# Patient Record
Sex: Female | Born: 1985 | Race: Black or African American | Hispanic: No | Marital: Single | Smoking: Never smoker
Health system: Southern US, Community
[De-identification: ages and names within clinical notes are randomized; demographics above are authoritative.]

## PROBLEM LIST (undated history)

## (undated) ENCOUNTER — Inpatient Hospital Stay (HOSPITAL_COMMUNITY): Payer: Self-pay

## (undated) DIAGNOSIS — I10 Essential (primary) hypertension: Secondary | ICD-10-CM

## (undated) DIAGNOSIS — O24419 Gestational diabetes mellitus in pregnancy, unspecified control: Secondary | ICD-10-CM

## (undated) HISTORY — DX: Gestational diabetes mellitus in pregnancy, unspecified control: O24.419

## (undated) HISTORY — DX: Essential (primary) hypertension: I10

## (undated) HISTORY — PX: NO PAST SURGERIES: SHX2092

---

## 2008-01-23 ENCOUNTER — Emergency Department (HOSPITAL_COMMUNITY): Admission: EM | Admit: 2008-01-23 | Discharge: 2008-01-23 | Payer: Self-pay | Admitting: Emergency Medicine

## 2011-06-14 ENCOUNTER — Emergency Department (HOSPITAL_COMMUNITY)
Admission: EM | Admit: 2011-06-14 | Discharge: 2011-06-14 | Disposition: A | Discharge status: Home / Self Care | Payer: No Typology Code available for payment source | Attending: Emergency Medicine | Admitting: Emergency Medicine

## 2011-06-14 ENCOUNTER — Encounter (HOSPITAL_COMMUNITY): Payer: Self-pay

## 2011-06-14 ENCOUNTER — Emergency Department (HOSPITAL_COMMUNITY): Payer: No Typology Code available for payment source

## 2011-06-14 DIAGNOSIS — M79609 Pain in unspecified limb: Secondary | ICD-10-CM | POA: Insufficient documentation

## 2011-06-14 DIAGNOSIS — M7989 Other specified soft tissue disorders: Secondary | ICD-10-CM | POA: Insufficient documentation

## 2011-06-14 DIAGNOSIS — M546 Pain in thoracic spine: Secondary | ICD-10-CM | POA: Insufficient documentation

## 2011-06-14 DIAGNOSIS — S161XXA Strain of muscle, fascia and tendon at neck level, initial encounter: Secondary | ICD-10-CM

## 2011-06-14 DIAGNOSIS — S139XXA Sprain of joints and ligaments of unspecified parts of neck, initial encounter: Secondary | ICD-10-CM | POA: Insufficient documentation

## 2011-06-14 DIAGNOSIS — S6390XA Sprain of unspecified part of unspecified wrist and hand, initial encounter: Secondary | ICD-10-CM | POA: Insufficient documentation

## 2011-06-14 DIAGNOSIS — M25569 Pain in unspecified knee: Secondary | ICD-10-CM | POA: Insufficient documentation

## 2011-06-14 LAB — POCT PREGNANCY, URINE: Preg Test, Ur: NEGATIVE

## 2011-06-14 MED ORDER — IBUPROFEN 400 MG PO TABS: 400.0000 mg | ORAL_TABLET | Freq: Three times a day (TID) | ORAL | Status: AC | PRN

## 2011-06-14 MED ORDER — IBUPROFEN 800 MG PO TABS
800.0000 mg | ORAL_TABLET | Freq: Once | ORAL | Status: AC
  Administered 2011-06-14: 800 mg via ORAL
  Filled 2011-06-14: qty 1

## 2011-06-14 MED ORDER — HYDROCODONE-ACETAMINOPHEN 5-325 MG PO TABS: 1.0000 | ORAL_TABLET | ORAL | Status: AC | PRN

## 2011-06-14 MED ORDER — HYDROCODONE-ACETAMINOPHEN 5-325 MG PO TABS
1.0000 | ORAL_TABLET | Freq: Once | ORAL | Status: AC
  Administered 2011-06-14: 1 via ORAL
  Filled 2011-06-14: qty 1

## 2011-06-14 MED ORDER — DIAZEPAM 5 MG PO TABS: 5.0000 mg | ORAL_TABLET | Freq: Three times a day (TID) | ORAL | Status: AC | PRN

## 2011-06-14 MED ORDER — DIAZEPAM 5 MG PO TABS
5.0000 mg | ORAL_TABLET | Freq: Once | ORAL | Status: AC
  Administered 2011-06-14: 5 mg via ORAL
  Filled 2011-06-14: qty 1
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
## 2011-06-24 NOTE — ED Provider Notes (Signed)
Medical screening examination/treatment/procedure(s) were performed by non-physician practitioner and as supervising physician I was immediately available for consultation/collaboration.  Rashawd Laskaris, MD 06/24/11 0057 

## 2013-06-24 ENCOUNTER — Emergency Department (HOSPITAL_BASED_OUTPATIENT_CLINIC_OR_DEPARTMENT_OTHER)
Admission: EM | Admit: 2013-06-24 | Discharge: 2013-06-24 | Disposition: A | Discharge status: Home / Self Care | Payer: Medicaid Other | Attending: Emergency Medicine | Admitting: Emergency Medicine

## 2013-06-24 ENCOUNTER — Encounter (HOSPITAL_BASED_OUTPATIENT_CLINIC_OR_DEPARTMENT_OTHER): Payer: Self-pay | Admitting: Emergency Medicine

## 2013-06-24 DIAGNOSIS — R0982 Postnasal drip: Secondary | ICD-10-CM | POA: Insufficient documentation

## 2013-06-24 DIAGNOSIS — J3489 Other specified disorders of nose and nasal sinuses: Secondary | ICD-10-CM | POA: Insufficient documentation

## 2013-06-24 DIAGNOSIS — J029 Acute pharyngitis, unspecified: Secondary | ICD-10-CM | POA: Insufficient documentation

## 2013-06-24 LAB — RAPID STREP SCREEN (MED CTR MEBANE ONLY): STREPTOCOCCUS, GROUP A SCREEN (DIRECT): NEGATIVE

## 2013-06-24 MED ORDER — DEXAMETHASONE SODIUM PHOSPHATE 10 MG/ML IJ SOLN
10.0000 mg | Freq: Once | INTRAMUSCULAR | Status: AC
  Administered 2013-06-24: 10 mg via INTRAMUSCULAR
  Filled 2013-06-24: qty 1

## 2013-06-24 NOTE — Discharge Instructions (Signed)
Pharyngitis °Pharyngitis is redness, pain, and swelling (inflammation) of your pharynx.  °CAUSES  °Pharyngitis is usually caused by infection. Most of the time, these infections are from viruses (viral) and are part of a cold. However, sometimes pharyngitis is caused by bacteria (bacterial). Pharyngitis can also be caused by allergies. Viral pharyngitis may be spread from person to person by coughing, sneezing, and personal items or utensils (cups, forks, spoons, toothbrushes). Bacterial pharyngitis may be spread from person to person by more intimate contact, such as kissing.  °SIGNS AND SYMPTOMS  °Symptoms of pharyngitis include:   °· Sore throat.   °· Tiredness (fatigue).   °· Low-grade fever.   °· Headache. °· Joint pain and muscle aches. °· Skin rashes. °· Swollen lymph nodes. °· Plaque-like film on throat or tonsils (often seen with bacterial pharyngitis). °DIAGNOSIS  °Your health care provider will ask you questions about your illness and your symptoms. Your medical history, along with a physical exam, is often all that is needed to diagnose pharyngitis. Sometimes, a rapid strep test is done. Other lab tests may also be done, depending on the suspected cause.  °TREATMENT  °Viral pharyngitis will usually get better in 3 4 days without the use of medicine. Bacterial pharyngitis is treated with medicines that kill germs (antibiotics).  °HOME CARE INSTRUCTIONS  °· Drink enough water and fluids to keep your urine clear or pale yellow.   °· Only take over-the-counter or prescription medicines as directed by your health care provider:   °· If you are prescribed antibiotics, make sure you finish them even if you start to feel better.   °· Do not take aspirin.   °· Get lots of rest.   °· Gargle with 8 oz of salt water (½ tsp of salt per 1 qt of water) as often as every 1 2 hours to soothe your throat.   °· Throat lozenges (if you are not at risk for choking) or sprays may be used to soothe your throat. °SEEK MEDICAL  CARE IF:  °· You have large, tender lumps in your neck. °· You have a rash. °· You cough up green, yellow-brown, or bloody spit. °SEEK IMMEDIATE MEDICAL CARE IF:  °· Your neck becomes stiff. °· You drool or are unable to swallow liquids. °· You vomit or are unable to keep medicines or liquids down. °· You have severe pain that does not go away with the use of recommended medicines. °· You have trouble breathing (not caused by a stuffy nose). °MAKE SURE YOU:  °· Understand these instructions. °· Will watch your condition. °· Will get help right away if you are not doing well or get worse. °Document Released: 05/02/2005 Document Revised: 02/20/2013 Document Reviewed: 01/07/2013 °Exit[employer]® Patient Information ©2014 [employer]. ° °

## 2013-06-24 NOTE — ED Notes (Signed)
Sore throat onset 3 days ago states coughs throughout the night also has some sinus congestion  And feels like her tonsils are swollen up

## 2013-06-24 NOTE — ED Provider Notes (Signed)
CSN: [id number]     Arrival date & time 06/24/13  0829 History   First MD Initiated Contact with Patient 06/24/13 [id number]     Chief Complaint  Patient presents with  . Sore Throat   (Consider location/radiation/quality/duration/timing/severity/associated sxs/prior Treatment) HPI Comments: Patient presents with a one-week history of swelling to her tonsils. She's had some pain to her tonsils on swallowing over the last 3 days. She's also had some postnasal drip and congestion in the back of her throat. She denies any fevers or chills. There is no nausea vomiting or diarrhea. She denies any shortness of breath or chest congestion. She denies any rashes. She's not been taking any medications at home for the symptoms.  Patient is a 28 y.o. female presenting with pharyngitis.  Sore Throat Pertinent negatives include no chest pain, no abdominal pain, no headaches and no shortness of breath.    History reviewed. No pertinent past medical history. History reviewed. No pertinent past surgical history. History reviewed. No pertinent family history. History  Substance Use Topics  . Smoking status: Never Smoker   . Smokeless tobacco: Not on file  . Alcohol Use: Yes   OB History   Grav Para Term Preterm Abortions TAB SAB Ect Mult Living                 Review of Systems  Constitutional: Negative for fever, chills, diaphoresis and fatigue.  HENT: Positive for congestion, postnasal drip and sore throat. Negative for rhinorrhea, sneezing, trouble swallowing and voice change.   Eyes: Negative.   Respiratory: Negative for cough, chest tightness and shortness of breath.   Cardiovascular: Negative for chest pain and leg swelling.  Gastrointestinal: Negative for nausea, vomiting, abdominal pain, diarrhea and blood in stool.  Genitourinary: Negative for frequency, hematuria, flank pain and difficulty urinating.  Musculoskeletal: Negative for arthralgias and back pain.  Skin: Negative for rash.   Neurological: Negative for dizziness, speech difficulty, weakness, numbness and headaches.    Allergies  Review of patient's allergies indicates no known allergies.  Home Medications   Current Outpatient Rx  Name  Route  Sig  Dispense  Refill  . ibuprofen (ADVIL,MOTRIN) 200 MG tablet   Oral   Take 400 mg by mouth every 6 (six) hours as needed. For pain          BP 143/93  Pulse 84  Temp(Src) 98 F (36.7 C) (Oral)  Resp 18  Ht 5\' 11"  (1.803 m)  Wt 240 lb (108.863 kg)  BMI 33.49 kg/m2  SpO2 97%  LMP 06/24/2013 Physical Exam  Constitutional: She is oriented to person, place, and time. She appears well-developed and well-nourished.  HENT:  Head: Normocephalic and atraumatic.  Right Ear: External ear normal.  Left Ear: External ear normal.  Nose: Nose normal.  The tonsils are enlarged bilaterally with some mild erythema. There's no exudates. Uvula is midline. No trismus.  Eyes: Pupils are equal, round, and reactive to light.  Neck: Normal range of motion. Neck supple.  Cardiovascular: Normal rate, regular rhythm and normal heart sounds.   Pulmonary/Chest: Effort normal and breath sounds normal. No respiratory distress. She has no wheezes. She has no rales. She exhibits no tenderness.  Abdominal: Soft. Bowel sounds are normal. There is no tenderness. There is no rebound and no guarding.  Musculoskeletal: Normal range of motion. She exhibits no edema.  Lymphadenopathy:    She has cervical adenopathy (mild).  Neurological: She is alert and oriented to person, place, and time.  Skin:  Skin is warm and dry. No rash noted.  Psychiatric: She has a normal mood and affect.    ED Course  Procedures (including critical care time) Labs Review Labs Reviewed  RAPID STREP SCREEN  CULTURE, GROUP A STREP   Imaging Review No results found.  EKG Interpretation   None       MDM   1. Pharyngitis    Patient negative rapid strep. Throat culture was obtained. I advised in  symptomatic care for her likely viral infection. She was given a shot of Decadron in the ED for symptomatic relief.    Rolan BuccoMelanie Vega Stare, MD 06/24/13 [phone]

## 2013-06-24 NOTE — ED Notes (Signed)
MD at bedside. 

## 2013-06-26 LAB — CULTURE, GROUP A STREP

## 2013-07-05 ENCOUNTER — Encounter (HOSPITAL_BASED_OUTPATIENT_CLINIC_OR_DEPARTMENT_OTHER): Payer: Self-pay | Admitting: Emergency Medicine

## 2013-07-05 ENCOUNTER — Emergency Department (HOSPITAL_BASED_OUTPATIENT_CLINIC_OR_DEPARTMENT_OTHER)
Admission: EM | Admit: 2013-07-05 | Discharge: 2013-07-05 | Disposition: A | Discharge status: Home / Self Care | Payer: Medicaid Other | Attending: Emergency Medicine | Admitting: Emergency Medicine

## 2013-07-05 DIAGNOSIS — J029 Acute pharyngitis, unspecified: Secondary | ICD-10-CM | POA: Insufficient documentation

## 2013-07-05 DIAGNOSIS — B86 Scabies: Secondary | ICD-10-CM | POA: Insufficient documentation

## 2013-07-05 MED ORDER — PERMETHRIN 5 % EX CREA: TOPICAL_CREAM | CUTANEOUS | Status: DC

## 2013-07-05 NOTE — ED Provider Notes (Signed)
Medical screening examination/treatment/procedure(s) were performed by non-physician practitioner and as supervising physician I was immediately available for consultation/collaboration.  EKG Interpretation   None          Glynn OctaveStephen Myriah Boggus, MD 07/05/13 [phone]

## 2013-07-05 NOTE — ED Provider Notes (Signed)
CSN: [id number]     Arrival date & time 07/05/13  1548 History   First MD Initiated Contact with Patient 07/05/13 1556     Chief Complaint  Patient presents with  . Rash     (Consider location/radiation/quality/duration/timing/severity/associated sxs/prior Treatment) HPI Comments: Patient is 28 year old female who presents to the ED with rash - she states that she initially noticed the rash about 4 days ago at the crease of bilateral forearms and to her breasts.  She states that since then it has become intensely itchy and worsening.  She reports the rash is red, raised and forms lines.  She states that no other person in her home has similar.  She denies fever, chills, nausea or vomiting - she was seen several days ago for sore throat and says that she continues with this.  She reports pain with swallowing but no difficulty swallowing, is able to handle secretions.  Patient is a 28 y.o. female presenting with rash. The history is provided by the patient. No language interpreter was used.  Rash Location:  Head/neck, torso and shoulder/arm Head/neck rash location:  Scalp, L ear, R ear, L neck and R neck Shoulder/arm rash location:  L upper arm, R upper arm, L forearm, R forearm, L elbow and R elbow Torso rash location:  Upper back, lower back, R chest, L chest, R flank and L flank Quality: itchiness and redness   Severity:  Moderate Onset quality:  Gradual Timing:  Constant Progression:  Worsening Chronicity:  New Context: not animal contact, not chemical exposure, not exposure to similar rash, not food, not medications, not new detergent/soap, not nuts, not plant contact, not sick contacts and not sun exposure   Relieved by:  Nothing Worsened by:  Nothing tried Ineffective treatments:  None tried Associated symptoms: sore throat   Associated symptoms: no abdominal pain, no diarrhea, no fatigue, no fever, no headaches, no hoarse voice, no induration, no joint pain, no nausea, no throat  swelling, no tongue swelling, no URI and not wheezing     History reviewed. No pertinent past medical history. History reviewed. No pertinent past surgical history. History reviewed. No pertinent family history. History  Substance Use Topics  . Smoking status: Never Smoker   . Smokeless tobacco: Not on file  . Alcohol Use: Yes   OB History   Grav Para Term Preterm Abortions TAB SAB Ect Mult Living                 Review of Systems  Constitutional: Negative for fever and fatigue.  HENT: Positive for sore throat. Negative for hoarse voice.   Respiratory: Negative for wheezing.   Gastrointestinal: Negative for nausea, abdominal pain and diarrhea.  Musculoskeletal: Negative for arthralgias.  Skin: Positive for rash.  Neurological: Negative for headaches.  All other systems reviewed and are negative.      Allergies  Review of patient's allergies indicates no known allergies.  Home Medications   Current Outpatient Rx  Name  Route  Sig  Dispense  Refill  . diphenhydrAMINE (BENADRYL) 25 mg capsule   Oral   Take 50 mg by mouth every 6 (six) hours as needed.         Marland Kitchen. ibuprofen (ADVIL,MOTRIN) 200 MG tablet   Oral   Take 400 mg by mouth every 6 (six) hours as needed. For pain          BP 150/95  Pulse 95  Temp(Src) 99.3 F (37.4 C) (Oral)  Resp 20  Ht 5\' 11"  (1.803 m)  Wt 245 lb (111.131 kg)  BMI 34.19 kg/m2  SpO2 95%  LMP 06/24/2013 Physical Exam  Nursing note and vitals reviewed. Constitutional: She is oriented to person, place, and time. She appears well-developed and well-nourished. No distress.  HENT:  Head: Normocephalic and atraumatic.  Right Ear: External ear normal.  Left Ear: External ear normal.  Nose: Nose normal.  Mouth/Throat: No oropharyngeal exudate.  Mild posterior pharyngeal erythema  Eyes: Conjunctivae are normal. Pupils are equal, round, and reactive to light. No scleral icterus.  Pulmonary/Chest: Effort normal.  Musculoskeletal:  Normal range of motion. She exhibits no edema and no tenderness.  Neurological: She is alert and oriented to person, place, and time. She exhibits normal muscle tone. Coordination normal.  Skin: Skin is warm and dry. Rash noted. There is erythema. No pallor.  Linear erythematous papular rash to bilateral antecubital fossa, back, chest.  Psychiatric: She has a normal mood and affect. Her behavior is normal. Judgment and thought content normal.    ED Course  Procedures (including critical care time) Labs Review Labs Reviewed - No data to display Imaging Review No results found.  EKG Interpretation   None       MDM   Scabies  Patient here with intensely itchy linear rash c/w scabies infestation.   Lynn Gallegos, [place] 07/05/13 1623

## 2013-07-05 NOTE — Discharge Instructions (Signed)
Scabies  Scabies are small bugs (mites) that burrow under the skin and cause red bumps and severe itching. These bugs can only be seen with a microscope. Scabies are highly contagious. They can spread easily from person to person by direct contact. They are also spread through sharing clothing or linens that have the scabies mites living in them. It is not unusual for an entire family to become infected through shared towels, clothing, or bedding.   HOME CARE INSTRUCTIONS   · Your caregiver may prescribe a cream or lotion to kill the mites. If cream is prescribed, massage the cream into the entire body from the neck to the bottom of both feet. Also massage the cream into the scalp and face if your child is less than 1 year old. Avoid the eyes and mouth. Do not wash your hands after application.  · Leave the cream on for 8 to 12 hours. Your child should bathe or shower after the 8 to 12 hour application period. Sometimes it is helpful to apply the cream to your child right before bedtime.  · One treatment is usually effective and will eliminate approximately 95% of infestations. For severe cases, your caregiver may decide to repeat the treatment in 1 week. Everyone in your household should be treated with one application of the cream.  · New rashes or burrows should not appear within 24 to 48 hours after successful treatment. However, the itching and rash may last for 2 to 4 weeks after successful treatment. Your caregiver may prescribe a medicine to help with the itching or to help the rash go away more quickly.  · Scabies can live on clothing or linens for up to 3 days. All of your child's recently used clothing, towels, stuffed toys, and bed linens should be washed in hot water and then dried in a dryer for at least 20 minutes on high heat. Items that cannot be washed should be enclosed in a plastic bag for at least 3 days.  · To help relieve itching, bathe your child in a cool bath or apply cool washcloths to the  affected areas.  · Your child may return to school after treatment with the prescribed cream.  SEEK MEDICAL CARE IF:   · The itching persists longer than 4 weeks after treatment.  · The rash spreads or becomes infected. Signs of infection include red blisters or yellow-tan crust.  Document Released: 05/02/2005 Document Revised: 07/25/2011 Document Reviewed: 09/10/2008  [employer]® Patient Information ©2014 [employer].

## 2013-07-05 NOTE — ED Notes (Signed)
Pt c/o red rash generalized over limbs, trunk.  Pt states that she was recently seen for sore throat, continues to c/o of same.

## 2013-12-11 ENCOUNTER — Encounter: Payer: Self-pay | Admitting: Obstetrics

## 2013-12-11 ENCOUNTER — Ambulatory Visit (INDEPENDENT_AMBULATORY_CARE_PROVIDER_SITE_OTHER): Payer: Medicaid Other | Admitting: Obstetrics

## 2013-12-11 VITALS — BP 133/85 | HR 88 | Temp 97.8°F | Wt 247.0 lb

## 2013-12-11 DIAGNOSIS — Z3400 Encounter for supervision of normal first pregnancy, unspecified trimester: Secondary | ICD-10-CM

## 2013-12-11 DIAGNOSIS — Z3402 Encounter for supervision of normal first pregnancy, second trimester: Secondary | ICD-10-CM

## 2013-12-11 LAB — POCT URINALYSIS DIPSTICK
Glucose, UA: NEGATIVE
KETONES UA: NEGATIVE
LEUKOCYTES UA: NEGATIVE
Nitrite, UA: NEGATIVE
PH UA: 5
RBC UA: NEGATIVE
SPEC GRAV UA: 1.025

## 2013-12-11 NOTE — Progress Notes (Signed)
  Subjective:    Lynn Gallegos is a 28 y.o. female being seen today for her obstetrical visit. She is at [redacted]w[redacted]d gestation. Patient reports: no complaints.  Problem List Items Addressed This Visit   None    Visit Diagnoses   Encounter for supervision of normal first pregnancy in second trimester    -  Primary    Relevant Orders       POCT urinalysis dipstick (Completed)       Obstetric panel       HIV antibody       Hemoglobinopathy evaluation       Varicella zoster antibody, IgG       Vit D  25 hydroxy (rtn osteoporosis monitoring)       Culture, OB Urine       Pap IG w/ reflex to HPV when ASC-U       WET PREP BY MOLECULAR PROBE       GC/Chlamydia Probe Amp      There are no active problems to display for this patient.   Objective:     BP 133/85  Pulse 88  Temp(Src) 97.8 F (36.6 C)  Wt 247 lb (112.038 kg)  LMP 09/15/2013 Uterine Size: Below umbilicus     Assessment:    Pregnancy @ [redacted]w[redacted]d  weeks Doing well    Plan:    Problem list reviewed and updated. Labs reviewed.  Follow up in 3 weeks. FIRST/CF mutation testing/NIPT/QUAD SCREEN/fragile X/Ashkenazi Jewish population testing/Spinal muscular atrophy discussed: requested. Role of ultrasound in pregnancy discussed; fetal survey: requested. Amniocentesis discussed: not indicated.

## 2013-12-11 NOTE — Addendum Note (Signed)
Addended by: Marya LandryFOSTER, Karmelo Bass D on: 12/11/2013 03:47 PM   Modules accepted: Orders

## 2013-12-12 ENCOUNTER — Other Ambulatory Visit: Payer: Self-pay | Admitting: *Deleted

## 2013-12-12 DIAGNOSIS — N760 Acute vaginitis: Principal | ICD-10-CM

## 2013-12-12 DIAGNOSIS — B9689 Other specified bacterial agents as the cause of diseases classified elsewhere: Secondary | ICD-10-CM

## 2013-12-12 LAB — OBSTETRIC PANEL
ANTIBODY SCREEN: NEGATIVE
BASOS ABS: 0 10*3/uL (ref 0.0–0.1)
BASOS PCT: 0 % (ref 0–1)
EOS ABS: 0.1 10*3/uL (ref 0.0–0.7)
EOS PCT: 1 % (ref 0–5)
HEMATOCRIT: 35.4 % — AB (ref 36.0–46.0)
Hemoglobin: 11.7 g/dL — ABNORMAL LOW (ref 12.0–15.0)
Hepatitis B Surface Ag: NEGATIVE
Lymphocytes Relative: 20 % (ref 12–46)
Lymphs Abs: 1.6 10*3/uL (ref 0.7–4.0)
MCH: 26.2 pg (ref 26.0–34.0)
MCHC: 33.1 g/dL (ref 30.0–36.0)
MCV: 79.2 fL (ref 78.0–100.0)
MONO ABS: 0.3 10*3/uL (ref 0.1–1.0)
MONOS PCT: 4 % (ref 3–12)
Neutro Abs: 6 10*3/uL (ref 1.7–7.7)
Neutrophils Relative %: 75 % (ref 43–77)
Platelets: 198 10*3/uL (ref 150–400)
RBC: 4.47 MIL/uL (ref 3.87–5.11)
RDW: 15.1 % (ref 11.5–15.5)
RH TYPE: POSITIVE
Rubella: 2.94 Index — ABNORMAL HIGH (ref <0.90)
WBC: 8 10*3/uL (ref 4.0–10.5)

## 2013-12-12 LAB — OB RESULTS CONSOLE GC/CHLAMYDIA
CHLAMYDIA, DNA PROBE: NEGATIVE
GC PROBE AMP, GENITAL: NEGATIVE

## 2013-12-12 LAB — PAP IG W/ RFLX HPV ASCU

## 2013-12-12 LAB — VITAMIN D 25 HYDROXY (VIT D DEFICIENCY, FRACTURES): Vit D, 25-Hydroxy: 25 ng/mL — ABNORMAL LOW (ref 30–89)

## 2013-12-12 LAB — WET PREP BY MOLECULAR PROBE
CANDIDA SPECIES: NEGATIVE
Gardnerella vaginalis: POSITIVE — AB
TRICHOMONAS VAG: NEGATIVE

## 2013-12-12 LAB — VARICELLA ZOSTER ANTIBODY, IGG: Varicella IgG: 2671 Index — ABNORMAL HIGH (ref <135.00)

## 2013-12-12 LAB — CULTURE, OB URINE
Colony Count: NO GROWTH
ORGANISM ID, BACTERIA: NO GROWTH

## 2013-12-12 LAB — HIV ANTIBODY (ROUTINE TESTING W REFLEX): HIV: NONREACTIVE

## 2013-12-12 LAB — GC/CHLAMYDIA PROBE AMP
CT PROBE, AMP APTIMA: NEGATIVE
GC PROBE AMP APTIMA: NEGATIVE

## 2013-12-12 MED ORDER — METRONIDAZOLE 500 MG PO TABS: 500.0000 mg | ORAL_TABLET | Freq: Two times a day (BID) | ORAL | Status: DC

## 2013-12-12 NOTE — Progress Notes (Signed)
Pt made aware of lab results and Metronidazole 500mg sent to pharmacy. 

## 2013-12-13 LAB — HEMOGLOBINOPATHY EVALUATION
HGB A2 QUANT: 2.8 % (ref 2.2–3.2)
HGB F QUANT: 0 % (ref 0.0–2.0)
Hemoglobin Other: 0 %
Hgb A: 97.2 % (ref 96.8–97.8)
Hgb S Quant: 0 %

## 2014-01-01 ENCOUNTER — Ambulatory Visit (INDEPENDENT_AMBULATORY_CARE_PROVIDER_SITE_OTHER): Payer: Medicaid Other | Admitting: Obstetrics

## 2014-01-01 ENCOUNTER — Encounter: Payer: Self-pay | Admitting: Obstetrics

## 2014-01-01 VITALS — BP 132/90 | HR 83 | Temp 98.2°F | Wt 246.0 lb

## 2014-01-01 DIAGNOSIS — Z1389 Encounter for screening for other disorder: Secondary | ICD-10-CM

## 2014-01-01 DIAGNOSIS — Z3402 Encounter for supervision of normal first pregnancy, second trimester: Secondary | ICD-10-CM

## 2014-01-01 DIAGNOSIS — Z3400 Encounter for supervision of normal first pregnancy, unspecified trimester: Secondary | ICD-10-CM

## 2014-01-01 DIAGNOSIS — Z3403 Encounter for supervision of normal first pregnancy, third trimester: Secondary | ICD-10-CM

## 2014-01-01 LAB — POCT URINALYSIS DIPSTICK
Bilirubin, UA: NEGATIVE
Blood, UA: NEGATIVE
Glucose, UA: NEGATIVE
KETONES UA: NEGATIVE
LEUKOCYTES UA: NEGATIVE
NITRITE UA: NEGATIVE
PH UA: 5
PROTEIN UA: NEGATIVE
Spec Grav, UA: 1.02
Urobilinogen, UA: NEGATIVE

## 2014-01-01 NOTE — Progress Notes (Signed)
  Subjective:    Lynn Gallegos is a 28 y.o. female being seen today for her obstetrical visit. She is at 8332w3d gestation. Patient reports: no complaints.  Problem List Items Addressed This Visit   None    Visit Diagnoses   Encounter for supervision of normal first pregnancy in second trimester    -  Primary    Encounter for supervision of normal first pregnancy in third trimester        Relevant Orders       POCT urinalysis dipstick       AFP, Quad Screen    Encounter for routine screening for malformation using ultrasonics        Relevant Orders       US OB Comp + 14 Wk      There are no active problems to display for this patient.   Objective:     BP 132/90  Pulse 83  Temp(Src) 98.2 F (36.8 C)  Wt 246 lb (111.585 kg)  LMP 09/15/2013 Uterine Size: Below umbilicus     Assessment:    Pregnancy @ 3232w3d  weeks Doing well    Plan:    Problem list reviewed and updated. Labs reviewed.  Follow up in 4 weeks. FIRST/CF mutation testing/NIPT/QUAD SCREEN/fragile X/Ashkenazi Jewish population testing/Spinal muscular atrophy discussed: requested. Role of ultrasound in pregnancy discussed; fetal survey: requested. Amniocentesis discussed: not indicated.

## 2014-01-06 LAB — AFP, QUAD SCREEN
AFP: 20.8 [IU]/mL
Age Alone: 1:964 {titer}
CURR GEST AGE: 15.3 wks.days
Down Syndrome Scr Risk Est: 1:25300 {titer}
HCG, Total: 14805 m[IU]/mL
INH: 97.2 pg/mL
INTERPRETATION-AFP: NEGATIVE
MOM FOR AFP: 0.94
MoM for INH: 0.64
MoM for hCG: 0.7
Open Spina bifida: NEGATIVE
Tri 18 Scr Risk Est: NEGATIVE
UE3 MOM: 0.94
UE3 VALUE: 0.3 ng/mL

## 2014-01-28 ENCOUNTER — Ambulatory Visit (HOSPITAL_COMMUNITY)
Admission: RE | Admit: 2014-01-28 | Discharge: 2014-01-28 | Disposition: A | Discharge status: Home / Self Care | Payer: Medicaid Other | Source: Ambulatory Visit | Attending: Obstetrics | Admitting: Obstetrics

## 2014-01-28 DIAGNOSIS — Z363 Encounter for antenatal screening for malformations: Secondary | ICD-10-CM | POA: Insufficient documentation

## 2014-01-28 DIAGNOSIS — Z1389 Encounter for screening for other disorder: Secondary | ICD-10-CM

## 2014-01-29 ENCOUNTER — Ambulatory Visit (INDEPENDENT_AMBULATORY_CARE_PROVIDER_SITE_OTHER): Payer: Medicaid Other | Admitting: Obstetrics

## 2014-01-29 ENCOUNTER — Encounter: Payer: Medicaid Other | Admitting: Obstetrics

## 2014-01-29 ENCOUNTER — Encounter: Payer: Self-pay | Admitting: Obstetrics

## 2014-01-29 VITALS — BP 134/82 | HR 80 | Temp 98.0°F | Wt 244.0 lb

## 2014-01-29 DIAGNOSIS — Z3400 Encounter for supervision of normal first pregnancy, unspecified trimester: Secondary | ICD-10-CM

## 2014-01-29 DIAGNOSIS — Z3402 Encounter for supervision of normal first pregnancy, second trimester: Secondary | ICD-10-CM

## 2014-01-29 NOTE — Progress Notes (Signed)
Subjective:    Lynn Gallegos is a 28 y.o. female being seen today for her obstetrical visit. She is at [redacted]w[redacted]d gestation. Patient reports: no complaints . Fetal movement: normal.  Problem List Items Addressed This Visit   None    Visit Diagnoses   Encounter for supervision of normal first pregnancy in second trimester    -  Primary    Relevant Orders       POCT urinalysis dipstick      There are no active problems to display for this patient.  Objective:    BP 134/82  Pulse 80  Temp(Src) 98 F (36.7 C)  Wt 244 lb (110.678 kg)  LMP 09/15/2013 FHT: 140 BPM  Uterine Size: size equals dates     Assessment:    Pregnancy @ [redacted]w[redacted]d    Plan:    OBGCT: ordered for next visit.  Labs, problem list reviewed and updated 2 hr GTT planned Follow up in 4 weeks.

## 2014-02-04 LAB — POCT URINALYSIS DIPSTICK
BILIRUBIN UA: NEGATIVE
GLUCOSE UA: NEGATIVE
Leukocytes, UA: NEGATIVE
Nitrite, UA: NEGATIVE
RBC UA: NEGATIVE
SPEC GRAV UA: 1.015
UROBILINOGEN UA: NEGATIVE
pH, UA: 5

## 2014-02-26 ENCOUNTER — Other Ambulatory Visit: Payer: Medicaid Other

## 2014-02-26 ENCOUNTER — Ambulatory Visit (INDEPENDENT_AMBULATORY_CARE_PROVIDER_SITE_OTHER): Payer: Medicaid Other | Admitting: Obstetrics

## 2014-02-26 VITALS — BP 137/84 | HR 76 | Temp 98.8°F | Wt 247.0 lb

## 2014-02-26 DIAGNOSIS — Z3402 Encounter for supervision of normal first pregnancy, second trimester: Secondary | ICD-10-CM

## 2014-02-26 LAB — POCT URINALYSIS DIPSTICK
BILIRUBIN UA: NEGATIVE
Blood, UA: NEGATIVE
GLUCOSE UA: NEGATIVE
KETONES UA: NEGATIVE
LEUKOCYTES UA: NEGATIVE
Nitrite, UA: NEGATIVE
PROTEIN UA: NEGATIVE
SPEC GRAV UA: 1.025
Urobilinogen, UA: NEGATIVE
pH, UA: 5

## 2014-02-27 ENCOUNTER — Encounter: Payer: Self-pay | Admitting: Obstetrics

## 2014-02-27 LAB — GLUCOSE TOLERANCE, 2 HOURS W/ 1HR
GLUCOSE, FASTING: 71 mg/dL (ref 70–99)
GLUCOSE: 98 mg/dL (ref 70–170)
Glucose, 2 hour: 76 mg/dL (ref 70–139)

## 2014-02-27 LAB — CBC
HCT: 35.6 % — ABNORMAL LOW (ref 36.0–46.0)
Hemoglobin: 11.6 g/dL — ABNORMAL LOW (ref 12.0–15.0)
MCH: 27.3 pg (ref 26.0–34.0)
MCHC: 32.6 g/dL (ref 30.0–36.0)
MCV: 83.8 fL (ref 78.0–100.0)
PLATELETS: 201 10*3/uL (ref 150–400)
RBC: 4.25 MIL/uL (ref 3.87–5.11)
RDW: 15.4 % (ref 11.5–15.5)
WBC: 10 10*3/uL (ref 4.0–10.5)

## 2014-02-27 LAB — HIV ANTIBODY (ROUTINE TESTING W REFLEX): HIV: NONREACTIVE

## 2014-02-27 LAB — RPR

## 2014-02-27 NOTE — Progress Notes (Signed)
Subjective:    Lynn Gallegos is a 28 y.o. female being seen today for her obstetrical visit. She is at 282w4d gestation. Patient reports: no complaints . Fetal movement: normal.  Problem List Items Addressed This Visit   None    Visit Diagnoses   Encounter for supervision of normal first pregnancy in second trimester    -  Primary    Relevant Orders       POCT urinalysis dipstick (Completed)       Glucose Tolerance, 2 Hours w/1 Hour       CBC       HIV antibody       RPR      There are no active problems to display for this patient.  Objective:    BP 137/84  Pulse 76  Temp(Src) 98.8 F (37.1 C)  Wt 247 lb (112.038 kg)  LMP 09/15/2013 FHT: 150 BPM  Uterine Size: size equals dates     Assessment:    Pregnancy @ 122w4d    Plan:    OBGCT: ordered.  Labs, problem list reviewed and updated 2 hr GTT planned Follow up in 4 weeks.

## 2014-03-17 ENCOUNTER — Encounter: Payer: Self-pay | Admitting: Obstetrics

## 2014-03-26 ENCOUNTER — Ambulatory Visit (INDEPENDENT_AMBULATORY_CARE_PROVIDER_SITE_OTHER): Payer: Medicaid Other | Admitting: Obstetrics

## 2014-03-26 ENCOUNTER — Encounter: Payer: Self-pay | Admitting: Obstetrics

## 2014-03-26 VITALS — BP 138/87 | HR 82 | Temp 98.4°F | Wt 251.0 lb

## 2014-03-26 DIAGNOSIS — Z3403 Encounter for supervision of normal first pregnancy, third trimester: Secondary | ICD-10-CM

## 2014-03-26 DIAGNOSIS — Z23 Encounter for immunization: Secondary | ICD-10-CM

## 2014-03-26 LAB — POCT URINALYSIS DIPSTICK
Blood, UA: NEGATIVE
Glucose, UA: NEGATIVE
KETONES UA: NEGATIVE
LEUKOCYTES UA: NEGATIVE
Nitrite, UA: NEGATIVE
PH UA: 5
PROTEIN UA: NEGATIVE
SPEC GRAV UA: 1.02

## 2014-03-26 NOTE — Progress Notes (Signed)
Subjective:    Lynn Gallegos is a 28 y.o. female being seen today for her obstetrical visit. She is at 8038w3d gestation. Patient reports: no complaints . Fetal movement: normal.  Problem List Items Addressed This Visit    None    Visit Diagnoses    Encounter for supervision of normal first pregnancy in second trimester    -  Primary    Relevant Orders       POCT urinalysis dipstick (Completed)    Need for immunization against influenza        Relevant Orders       Flu Vaccine QUAD 36+ mos IM (Fluarix) (Completed)      There are no active problems to display for this patient.  Objective:    BP 138/87 mmHg  Pulse 82  Temp(Src) 98.4 F (36.9 C)  Wt 251 lb (113.853 kg)  LMP 09/15/2013 FHT: 150 BPM  Uterine Size: size equals dates     Assessment:    Pregnancy @ 3338w3d    Plan:    OBGCT: discussed.  Labs, problem list reviewed and updated 2 hr GTT planned Follow up in 2 weeks.

## 2014-03-26 NOTE — Progress Notes (Signed)
Patient is doing well

## 2014-04-09 ENCOUNTER — Ambulatory Visit (INDEPENDENT_AMBULATORY_CARE_PROVIDER_SITE_OTHER): Payer: Medicaid Other | Admitting: Obstetrics

## 2014-04-09 ENCOUNTER — Encounter: Payer: Self-pay | Admitting: Obstetrics

## 2014-04-09 VITALS — BP 133/89 | HR 90 | Temp 97.8°F | Wt 252.0 lb

## 2014-04-09 DIAGNOSIS — Z3403 Encounter for supervision of normal first pregnancy, third trimester: Secondary | ICD-10-CM

## 2014-04-09 LAB — POCT URINALYSIS DIPSTICK
BILIRUBIN UA: NEGATIVE
Blood, UA: NEGATIVE
Glucose, UA: NEGATIVE
KETONES UA: NEGATIVE
LEUKOCYTES UA: NEGATIVE
Nitrite, UA: NEGATIVE
Spec Grav, UA: 1.02
Urobilinogen, UA: NEGATIVE
pH, UA: 6

## 2014-04-09 NOTE — Progress Notes (Signed)
Subjective:    Lynn Gallegos is a 28 y.o. female being seen today for her obstetrical visit. She is at 6490w3d gestation. Patient reports no complaints. Fetal movement: normal.  Problem List Items Addressed This Visit    None    Visit Diagnoses    Encounter for supervision of normal first pregnancy in third trimester    -  Primary    Relevant Orders       POCT urinalysis dipstick      There are no active problems to display for this patient.  Objective:    BP 133/89 mmHg  Pulse 90  Temp(Src) 97.8 F (36.6 C)  Wt 252 lb (114.306 kg)  LMP 09/15/2013 FHT:  150 BPM  Uterine Size: size equals dates  Presentation: unsure     Assessment:    Pregnancy @ 4290w3d weeks   Plan:     labs reviewed, problem list updated Consent signed. GBS sent TDAP offered  Rhogam given for RH negative Pediatrician: discussed. Infant feeding: plans to breastfeed. Maternity leave: not discussed. Cigarette smoking: never smoked. Orders Placed This Encounter  Procedures  . POCT urinalysis dipstick   No orders of the defined types were placed in this encounter.   Follow up in 2 Weeks.

## 2014-04-21 ENCOUNTER — Encounter (HOSPITAL_BASED_OUTPATIENT_CLINIC_OR_DEPARTMENT_OTHER): Payer: Self-pay | Admitting: Emergency Medicine

## 2014-04-21 ENCOUNTER — Emergency Department (HOSPITAL_BASED_OUTPATIENT_CLINIC_OR_DEPARTMENT_OTHER)
Admission: EM | Admit: 2014-04-21 | Discharge: 2014-04-22 | Disposition: A | Discharge status: Home / Self Care | Payer: Medicaid Other | Attending: Emergency Medicine | Admitting: Emergency Medicine

## 2014-04-21 DIAGNOSIS — J029 Acute pharyngitis, unspecified: Secondary | ICD-10-CM | POA: Diagnosis present

## 2014-04-21 DIAGNOSIS — I10 Essential (primary) hypertension: Secondary | ICD-10-CM | POA: Insufficient documentation

## 2014-04-21 DIAGNOSIS — L259 Unspecified contact dermatitis, unspecified cause: Secondary | ICD-10-CM | POA: Diagnosis not present

## 2014-04-21 LAB — RAPID STREP SCREEN (MED CTR MEBANE ONLY): STREPTOCOCCUS, GROUP A SCREEN (DIRECT): NEGATIVE

## 2014-04-21 NOTE — ED Provider Notes (Signed)
CSN: [id number]     Arrival date & time 04/21/14  2025 History  This chart was scribed for Lynn Buscemi Smitty CordsK Joshwa Hemric-Rasch, MD by Lynn Gallegos, ED Scribe. This patient was seen in room MH08/MH08 and the patient's care was started 11:17 PM.    Chief Complaint  Patient presents with  . sore throat and rash      Patient is a 28 y.o. female presenting with pharyngitis. The history is provided by the patient. No language interpreter was used.  Sore Throat This is a new problem. The current episode started more than 2 days ago. The problem occurs constantly. The problem has not changed since onset.Pertinent negatives include no chest pain, no abdominal pain, no headaches and no shortness of breath. Nothing aggravates the symptoms. Nothing relieves the symptoms. She has tried nothing for the symptoms. The treatment provided no relief.   HPI Comments: Lynn Gallegos is a 28 y.o. female who is pregnant who presents to the Emergency Department complaining of an unchanged sore throat that started 3 days ago. She states her PCP told her to start Claritin to see if that helped her symptoms but she has not experienced any relief.   Pt also complains of bilateral hand rash that started today at 4PM. Pt denies any new soaps or detergents at home. She states she has been at work at [employer] all day and that she did not come into contact with any different soaps or detergents recently. She says nothing has changed in the workplace that she knows of. Patient reports no modifying factors at this time. She reports no pertinent past medical history currently.    Past Medical History  Diagnosis Date  . Hypertension    Past Surgical History  Procedure Laterality Date  . No past surgeries     Family History  Problem Relation Age of Onset  . Aneurysm Mother    History  Substance Use Topics  . Smoking status: Never Smoker   . Smokeless tobacco: Never Used  . Alcohol Use: No   OB History    Gravida Para Term Preterm  AB TAB SAB Ectopic Multiple Living   1              Review of Systems  HENT: Positive for sore throat.   Respiratory: Negative for shortness of breath.   Cardiovascular: Negative for chest pain.  Gastrointestinal: Negative for abdominal pain.  Skin: Positive for rash.  Neurological: Negative for headaches.  All other systems reviewed and are negative.     Allergies  Review of patient's allergies indicates no known allergies.  Home Medications   Prior to Admission medications   Medication Sig Start Date End Date Taking? Authorizing Provider  Prenatal Vit-Fe Fumarate-FA (MULTIVITAMIN-PRENATAL) 27-0.8 MG TABS tablet Take 1 tablet by mouth daily at 12 noon.    Historical Provider, MD   BP 151/82 mmHg  Pulse 85  Temp(Src) 98.2 F (36.8 C) (Oral)  Resp 18  Ht 5\' 11"  (1.803 m)  Wt 253 lb (114.76 kg)  BMI 35.30 kg/m2  SpO2 100%  LMP 09/15/2013 Physical Exam  Constitutional: She is oriented to person, place, and time. She appears well-developed and well-nourished. No distress.  HENT:  Head: Normocephalic and atraumatic.  Right Ear: Hearing normal.  Left Ear: Hearing normal.  Nose: Nose normal.  Mouth/Throat: Oropharynx is clear and moist and mucous membranes are normal. No oropharyngeal exudate.  No swelling of lips, tongue or uvula.   Eyes: Conjunctivae and EOM are normal. Pupils  are equal, round, and reactive to light.  Neck: Normal range of motion. Neck supple. No tracheal deviation present.  Phonation intact no pain with displacement of the trachea  Cardiovascular: Normal rate, regular rhythm, S1 normal, S2 normal and normal heart sounds.  Exam reveals no gallop and no friction rub.   No murmur heard. Pulmonary/Chest: Effort normal and breath sounds normal. No stridor. No respiratory distress. She has no wheezes. She has no rales. She exhibits no tenderness.  Abdominal: Soft. Normal appearance and bowel sounds are normal. There is no hepatosplenomegaly. There is no  tenderness. There is no rebound, no guarding, no tenderness at McBurney's point and negative Murphy's sign. No hernia.  Positive fetal movement.   Musculoskeletal: Normal range of motion.  Lymphadenopathy:    She has no cervical adenopathy.  Neurological: She is alert and oriented to person, place, and time. She has normal strength. No cranial nerve deficit or sensory deficit. Coordination normal. GCS eye subscore is 4. GCS verbal subscore is 5. GCS motor subscore is 6.  Skin: Skin is warm, dry and intact. Rash noted. No cyanosis.  Macular red lesions dorsum of both hands. No warmth, itchy.   Psychiatric: She has a normal mood and affect. Her speech is normal and behavior is normal. Thought content normal.  Nursing note and vitals reviewed.   ED Course  Procedures  DIAGNOSTIC STUDIES: Oxygen Saturation is 100% on RA, normal by my interpretation.    COORDINATION OF CARE: 11:23 PM Discussed treatment plan with pt at bedside and pt agreed to plan.   Labs Review Labs Reviewed  RAPID STREP SCREEN  CULTURE, GROUP A STREP    Imaging Review No results found.   EKG Interpretation None      MDM   Final diagnoses:  None   Based on Centor criteria no indication for further testing indicated tylenol for pain and salt water gargles.  Contact dermatitis of the hand cortisone cream BID and removing the offending agent.  Follow up with your OB in 48 hours for recheck with your OB   I personally performed the services described in this documentation, which was scribed in my presence. The recorded information has been reviewed and is accurate.      Lynn Badour Smitty CordsK Edgar Corrigan-Rasch, MD 04/22/14 0005

## 2014-04-21 NOTE — ED Notes (Signed)
Pt states that rash started today and sore throat has been hurting for the last 2-3 days

## 2014-04-22 ENCOUNTER — Encounter (HOSPITAL_BASED_OUTPATIENT_CLINIC_OR_DEPARTMENT_OTHER): Payer: Self-pay | Admitting: Emergency Medicine

## 2014-04-22 MED ORDER — ACETAMINOPHEN 500 MG PO TABS: 1000.0000 mg | ORAL_TABLET | Freq: Once | ORAL | Status: DC

## 2014-04-22 MED ORDER — HYDROCORTISONE 1 % EX CREA: TOPICAL_CREAM | CUTANEOUS | Status: DC

## 2014-04-23 ENCOUNTER — Encounter: Payer: Medicaid Other | Admitting: Obstetrics

## 2014-04-23 LAB — CULTURE, GROUP A STREP

## 2014-04-30 ENCOUNTER — Encounter: Payer: Medicaid Other | Admitting: Obstetrics

## 2014-05-21 ENCOUNTER — Ambulatory Visit (INDEPENDENT_AMBULATORY_CARE_PROVIDER_SITE_OTHER): Payer: Medicaid Other | Admitting: Obstetrics

## 2014-05-21 ENCOUNTER — Encounter: Payer: Self-pay | Admitting: Obstetrics

## 2014-05-21 VITALS — BP 145/89 | HR 98 | Temp 98.0°F | Wt 257.0 lb

## 2014-05-21 DIAGNOSIS — Z3403 Encounter for supervision of normal first pregnancy, third trimester: Secondary | ICD-10-CM

## 2014-05-21 LAB — POCT URINALYSIS DIPSTICK
BILIRUBIN UA: NEGATIVE
Blood, UA: NEGATIVE
GLUCOSE UA: NEGATIVE
Ketones, UA: NEGATIVE
Nitrite, UA: NEGATIVE
PH UA: 5
SPEC GRAV UA: 1.02
Urobilinogen, UA: NEGATIVE

## 2014-05-21 NOTE — Progress Notes (Signed)
Subjective:    Lynn Gallegos is a 29 y.o. female being seen today for her obstetrical visit. She is at 4823w3d gestation. Patient reports no complaints. Fetal movement: normal.  Problem List Items Addressed This Visit    None     There are no active problems to display for this patient.  Objective:    BP 145/89 mmHg  Pulse 98  Temp(Src) 98 F (36.7 C)  Wt 257 lb (116.574 kg)  LMP 09/15/2013 FHT:  150 BPM  Uterine Size: size equals dates  Presentation: unsure     Assessment:    Pregnancy @ 3523w3d weeks   Plan:     labs reviewed, problem list updated Consent signed. GBS sent TDAP offered  Rhogam given for RH negative Pediatrician: discussed. Infant feeding: plans to breastfeed. Maternity leave: discussed. Cigarette smoking: never smoked. No orders of the defined types were placed in this encounter.   No orders of the defined types were placed in this encounter.   Follow up in 1 Week.

## 2014-05-21 NOTE — Addendum Note (Signed)
Addended by: Henriette CombsHATTON, ANDREA L on: 05/21/2014 02:10 PM   Modules accepted: Orders

## 2014-05-21 NOTE — Addendum Note (Signed)
Addended by: Marya LandryFOSTER, SUZANNE D on: 05/21/2014 02:31 PM   Modules accepted: Orders

## 2014-05-22 LAB — STREP B DNA PROBE: GBSP: NOT DETECTED

## 2014-05-29 ENCOUNTER — Encounter: Payer: Medicaid Other | Admitting: Obstetrics

## 2014-06-05 ENCOUNTER — Other Ambulatory Visit: Payer: Self-pay | Admitting: *Deleted

## 2014-06-05 ENCOUNTER — Encounter: Payer: Self-pay | Admitting: Obstetrics

## 2014-06-05 ENCOUNTER — Ambulatory Visit (INDEPENDENT_AMBULATORY_CARE_PROVIDER_SITE_OTHER): Payer: Medicaid Other | Admitting: Obstetrics

## 2014-06-05 VITALS — BP 137/87 | HR 82 | Temp 98.8°F | Wt 258.0 lb

## 2014-06-05 DIAGNOSIS — Z3403 Encounter for supervision of normal first pregnancy, third trimester: Secondary | ICD-10-CM

## 2014-06-05 DIAGNOSIS — K219 Gastro-esophageal reflux disease without esophagitis: Secondary | ICD-10-CM

## 2014-06-05 LAB — POCT URINALYSIS DIPSTICK
BILIRUBIN UA: NEGATIVE
Glucose, UA: NEGATIVE
KETONES UA: NEGATIVE
NITRITE UA: NEGATIVE
RBC UA: NEGATIVE
Spec Grav, UA: 1.015
Urobilinogen, UA: NEGATIVE
pH, UA: 6.5

## 2014-06-05 MED ORDER — OMEPRAZOLE 20 MG PO CPDR: 20.0000 mg | DELAYED_RELEASE_CAPSULE | Freq: Two times a day (BID) | ORAL | Status: DC

## 2014-06-05 NOTE — Progress Notes (Signed)
Subjective:    Lynn Gallegos is a 29 y.o. female being seen today for her obstetrical visit. She is at 821w4d gestation. Patient reports no complaints. Fetal movement: normal.  Problem List Items Addressed This Visit    None    Visit Diagnoses    Encounter for supervision of normal first pregnancy in third trimester    -  Primary    Relevant Orders    POCT urinalysis dipstick (Completed)      There are no active problems to display for this patient.   Objective:    BP 137/87 mmHg  Pulse 82  Temp(Src) 98.8 F (37.1 C)  Wt 258 lb (117.028 kg)  LMP 09/15/2013 FHT: 140 BPM  Uterine Size: size equals dates  Presentations: unsure  Pelvic Exam: Deferred    Assessment:    Pregnancy @ 6021w4d weeks   Plan:   Plans for delivery: Vaginal anticipated; labs reviewed; problem list updated Counseling: Consent signed. Infant feeding: plans to breastfeed. Cigarette smoking: never smoked. L&D discussion: symptoms of labor, discussed when to call, discussed what number to call, anesthetic/analgesic options reviewed and delivering clinician:  plans Physician. Postpartum supports and preparation: circumcision discussed and contraception plans discussed.  Follow up in 1 Week.

## 2014-06-06 ENCOUNTER — Encounter: Payer: Medicaid Other | Admitting: Obstetrics

## 2014-06-12 ENCOUNTER — Ambulatory Visit (INDEPENDENT_AMBULATORY_CARE_PROVIDER_SITE_OTHER): Payer: Medicaid Other | Admitting: Obstetrics

## 2014-06-12 VITALS — BP 136/82 | HR 84 | Temp 97.6°F | Wt 259.0 lb

## 2014-06-12 DIAGNOSIS — Z3403 Encounter for supervision of normal first pregnancy, third trimester: Secondary | ICD-10-CM

## 2014-06-12 LAB — POCT URINALYSIS DIPSTICK
Bilirubin, UA: NEGATIVE
Glucose, UA: NEGATIVE
Ketones, UA: NEGATIVE
Leukocytes, UA: NEGATIVE
Nitrite, UA: NEGATIVE
RBC UA: NEGATIVE
SPEC GRAV UA: 1.02
Urobilinogen, UA: NEGATIVE
pH, UA: 5

## 2014-06-13 ENCOUNTER — Encounter: Payer: Self-pay | Admitting: Obstetrics

## 2014-06-13 NOTE — Progress Notes (Signed)
Subjective:    Lynn Gallegos is a 29 y.o. female being seen today for her obstetrical visit. She is at 7454w5d gestation. Patient reports no complaints. Fetal movement: normal.  Problem List Items Addressed This Visit    None    Visit Diagnoses    Encounter for supervision of normal first pregnancy in third trimester    -  Primary    Relevant Orders    POCT urinalysis dipstick (Completed)      There are no active problems to display for this patient.   Objective:    BP 136/82 mmHg  Pulse 84  Temp(Src) 97.6 F (36.4 C)  Wt 259 lb (117.482 kg)  LMP 09/15/2013 FHT: 140 BPM  Uterine Size: size equals dates  Presentations: unsure  Pelvic Exam: Deferred   Assessment:    Pregnancy @ 5354w5d weeks   Plan:   Plans for delivery: Vaginal anticipated; labs reviewed; problem list updated Counseling: Consent signed. Infant feeding: plans to breastfeed. Cigarette smoking: never smoked. L&D discussion: symptoms of labor, discussed when to call, discussed what number to call, anesthetic/analgesic options reviewed and delivering clinician:  plans Physician. Postpartum supports and preparation: circumcision discussed and contraception plans discussed.  Follow up in 1 Week.

## 2014-06-18 ENCOUNTER — Encounter (HOSPITAL_COMMUNITY): Payer: Self-pay | Admitting: *Deleted

## 2014-06-18 ENCOUNTER — Inpatient Hospital Stay (HOSPITAL_COMMUNITY)
Admission: AD | Admit: 2014-06-18 | Discharge: 2014-06-20 | DRG: 782 | Disposition: A | Discharge status: Home / Self Care | Payer: Medicaid Other | Source: Ambulatory Visit | Attending: Obstetrics | Admitting: Obstetrics

## 2014-06-18 ENCOUNTER — Ambulatory Visit (INDEPENDENT_AMBULATORY_CARE_PROVIDER_SITE_OTHER): Payer: Medicaid Other | Admitting: Obstetrics

## 2014-06-18 VITALS — BP 134/95 | HR 84 | Temp 98.2°F | Wt 262.0 lb

## 2014-06-18 DIAGNOSIS — O133 Gestational [pregnancy-induced] hypertension without significant proteinuria, third trimester: Principal | ICD-10-CM | POA: Diagnosis present

## 2014-06-18 DIAGNOSIS — Z3403 Encounter for supervision of normal first pregnancy, third trimester: Secondary | ICD-10-CM

## 2014-06-18 DIAGNOSIS — R030 Elevated blood-pressure reading, without diagnosis of hypertension: Secondary | ICD-10-CM | POA: Diagnosis present

## 2014-06-18 DIAGNOSIS — Z3A39 39 weeks gestation of pregnancy: Secondary | ICD-10-CM | POA: Diagnosis present

## 2014-06-18 DIAGNOSIS — O139 Gestational [pregnancy-induced] hypertension without significant proteinuria, unspecified trimester: Secondary | ICD-10-CM | POA: Diagnosis present

## 2014-06-18 LAB — COMPREHENSIVE METABOLIC PANEL
ALBUMIN: 3.1 g/dL — AB (ref 3.5–5.2)
ALK PHOS: 123 U/L — AB (ref 39–117)
ALT: 24 U/L (ref 0–35)
AST: 18 U/L (ref 0–37)
Anion gap: 7 (ref 5–15)
BUN: 10 mg/dL (ref 6–23)
CALCIUM: 8.8 mg/dL (ref 8.4–10.5)
CHLORIDE: 106 mmol/L (ref 96–112)
CO2: 22 mmol/L (ref 19–32)
CREATININE: 0.55 mg/dL (ref 0.50–1.10)
GFR calc Af Amer: >90 mL/min (ref >90)
GFR calc non Af Amer: >90 mL/min (ref >90)
Glucose, Bld: 85 mg/dL (ref 70–99)
Potassium: 3.8 mmol/L (ref 3.5–5.1)
Sodium: 135 mmol/L (ref 135–145)
TOTAL PROTEIN: 6.9 g/dL (ref 6.0–8.3)
Total Bilirubin: 0.3 mg/dL (ref 0.3–1.2)

## 2014-06-18 LAB — CBC
HEMATOCRIT: 34.1 % — AB (ref 36.0–46.0)
HEMATOCRIT: 33.1 % — AB (ref 36.0–46.0)
HEMOGLOBIN: 11.2 g/dL — AB (ref 12.0–15.0)
Hemoglobin: 11.3 g/dL — ABNORMAL LOW (ref 12.0–15.0)
MCH: 27.3 pg (ref 26.0–34.0)
MCH: 27.9 pg (ref 26.0–34.0)
MCHC: 33.1 g/dL (ref 30.0–36.0)
MCHC: 33.8 g/dL (ref 30.0–36.0)
MCV: 82.4 fL (ref 78.0–100.0)
MCV: 82.3 fL (ref 78.0–100.0)
PLATELETS: 172 10*3/uL (ref 150–400)
Platelets: 164 10*3/uL (ref 150–400)
RBC: 4.14 MIL/uL (ref 3.87–5.11)
RBC: 4.02 MIL/uL (ref 3.87–5.11)
RDW: 13.2 % (ref 11.5–15.5)
RDW: 13.2 % (ref 11.5–15.5)
WBC: 9.7 10*3/uL (ref 4.0–10.5)
WBC: 10.1 10*3/uL (ref 4.0–10.5)

## 2014-06-18 LAB — URINALYSIS, ROUTINE W REFLEX MICROSCOPIC
Bilirubin Urine: NEGATIVE
Glucose, UA: NEGATIVE mg/dL
Hgb urine dipstick: NEGATIVE
Ketones, ur: 15 mg/dL — AB
LEUKOCYTES UA: NEGATIVE
Nitrite: NEGATIVE
Protein, ur: NEGATIVE mg/dL
Specific Gravity, Urine: 1.02 (ref 1.005–1.030)
UROBILINOGEN UA: 0.2 mg/dL (ref 0.0–1.0)
pH: 6.5 (ref 5.0–8.0)

## 2014-06-18 LAB — TYPE AND SCREEN
ABO/RH(D): O POS
Antibody Screen: NEGATIVE

## 2014-06-18 LAB — PROTEIN / CREATININE RATIO, URINE
Creatinine, Urine: 279 mg/dL
PROTEIN CREATININE RATIO: 0.04 (ref 0.00–0.15)
TOTAL PROTEIN, URINE: 11 mg/dL

## 2014-06-18 LAB — URIC ACID: Uric Acid, Serum: 4 mg/dL (ref 2.4–7.0)

## 2014-06-18 LAB — ABO/RH: ABO/RH(D): O POS

## 2014-06-18 MED ORDER — LACTATED RINGERS IV SOLN
INTRAVENOUS | Status: DC
  Administered 2014-06-18 – 2014-06-19 (×4): via INTRAVENOUS

## 2014-06-18 MED ORDER — OXYTOCIN BOLUS FROM INFUSION: 500.0000 mL | INTRAVENOUS | Status: DC

## 2014-06-18 MED ORDER — LABETALOL HCL 100 MG PO TABS: 100.0000 mg | ORAL_TABLET | Freq: Once | ORAL | Status: DC | PRN

## 2014-06-18 MED ORDER — ONDANSETRON HCL 4 MG/2ML IJ SOLN: 4.0000 mg | Freq: Four times a day (QID) | INTRAMUSCULAR | Status: DC | PRN

## 2014-06-18 MED ORDER — LIDOCAINE HCL (PF) 1 % IJ SOLN: 30.0000 mL | INTRAMUSCULAR | Status: DC | PRN

## 2014-06-18 MED ORDER — ACETAMINOPHEN 325 MG PO TABS
650.0000 mg | ORAL_TABLET | ORAL | Status: DC | PRN
Start: 2014-06-18 — End: 2014-06-20

## 2014-06-18 MED ORDER — MISOPROSTOL 25 MCG QUARTER TABLET
25.0000 ug | ORAL_TABLET | ORAL | Status: DC | PRN
  Administered 2014-06-18 – 2014-06-20 (×7): 25 ug via VAGINAL
  Filled 2014-06-18 (×2): qty 0.25
  Filled 2014-06-18: qty 1
  Filled 2014-06-18: qty 0.25
  Filled 2014-06-18: qty 1
  Filled 2014-06-18 (×2): qty 0.25
  Filled 2014-06-18: qty 1
  Filled 2014-06-18 (×2): qty 0.25

## 2014-06-18 MED ORDER — CITRIC ACID-SODIUM CITRATE 334-500 MG/5ML PO SOLN: 30.0000 mL | ORAL | Status: DC | PRN

## 2014-06-18 MED ORDER — OXYCODONE-ACETAMINOPHEN 5-325 MG PO TABS: 2.0000 | ORAL_TABLET | ORAL | Status: DC | PRN

## 2014-06-18 MED ORDER — OXYCODONE-ACETAMINOPHEN 5-325 MG PO TABS: 1.0000 | ORAL_TABLET | ORAL | Status: DC | PRN

## 2014-06-18 MED ORDER — LACTATED RINGERS IV SOLN: 500.0000 mL | INTRAVENOUS | Status: DC | PRN

## 2014-06-18 MED ORDER — TERBUTALINE SULFATE 1 MG/ML IJ SOLN: 0.2500 mg | Freq: Once | INTRAMUSCULAR | Status: AC | PRN

## 2014-06-18 MED ORDER — OXYTOCIN 40 UNITS IN LACTATED RINGERS INFUSION - SIMPLE MED: 62.5000 mL/h | INTRAVENOUS | Status: DC

## 2014-06-18 NOTE — MAU Note (Signed)
Sent from office.  BP was elevated last wk, came down when rechecked; high again today.  Denies HA, visual changes or epigastric pain, slight increase in swelling.

## 2014-06-18 NOTE — H&P (Signed)
Lynn Gallegos is a 29 y.o. female presenting for elevated BP in the office. Maternal Medical History:  Fetal activity: Perceived fetal activity is normal.   Last perceived fetal movement was within the past hour.    Prenatal complications: PIH.   Prenatal Complications - Diabetes: none.    OB History    Gravida Para Term Preterm AB TAB SAB Ectopic Multiple Living   1              Past Medical History  Diagnosis Date  . Hypertension    Past Surgical History  Procedure Laterality Date  . No past surgeries     Family History: family history includes Aneurysm in her mother. Social History:  reports that she has never smoked. She has never used smokeless tobacco. She reports that she does not drink alcohol or use illicit drugs.   Prenatal Transfer Tool  Maternal Diabetes: No Genetic Screening: Normal Maternal Ultrasounds/Referrals: Normal Fetal Ultrasounds or other Referrals:  None Maternal Substance Abuse:  No Significant Maternal Medications:  None Significant Maternal Lab Results:  None Other Comments:  None  Review of Systems  All other systems reviewed and are negative.     Blood pressure 152/84, pulse 84, temperature 98.6 F (37 C), resp. rate 16, height 5\' 9"  (1.753 m), weight 262 lb (118.842 kg), last menstrual period 09/15/2013, SpO2 100 %. Maternal Exam:  Abdomen: Patient reports no abdominal tenderness. Fetal presentation: vertex  Cervix: Cervix evaluated by digital exam.     Physical Exam  Nursing note and vitals reviewed. Constitutional: She is oriented to person, place, and time. She appears well-developed and well-nourished.  HENT:  Head: Normocephalic and atraumatic.  Eyes: Conjunctivae are normal. Pupils are equal, round, and reactive to light.  Neck: Normal range of motion. Neck supple.  Cardiovascular: Normal rate and regular rhythm.   Respiratory: Effort normal and breath sounds normal.  GI: Soft.  Genitourinary: Vagina normal and uterus  normal.  Musculoskeletal: Normal range of motion.  Neurological: She is alert and oriented to person, place, and time.  Skin: Skin is warm and dry.  Psychiatric: She has a normal mood and affect. Her behavior is normal. Judgment and thought content normal.    Prenatal labs: ABO, Rh: O/POS/-- (07/29 1323) Antibody: NEG (07/29 1323) Rubella: 2.94 (07/29 1323) RPR: NON REAC (10/14 1203)  HBsAg: NEGATIVE (07/29 1323)  HIV: NONREACTIVE (10/14 1203)  GBS: NOT DETECTED (01/06 1439)   Assessment/Plan: 39 weeks.  PIH.  Admit.  2 stage IOL.   Lynn Gallegos A 06/18/2014, 1:50 PM

## 2014-06-18 NOTE — H&P (Signed)
LABOR ADMISSION HISTORY AND PHYSICAL  Lynn Gallegos is a 29 y.o. female G1P0 with IUP at 6485w3d by LMP presenting for evaluation of elevated blood prsesures. She reports +FMs, No LOF, no VB, no blurry vision, headaches or peripheral edema, and RUQ pain.  Dating: By LMP --->  Estimated Date of Delivery: 06/22/14    Prenatal History/Complications:  Past Medical History: Past Medical History  Diagnosis Date  . Hypertension     Past Surgical History: Past Surgical History  Procedure Laterality Date  . No past surgeries      Obstetrical History: OB History    Gravida Para Term Preterm AB TAB SAB Ectopic Multiple Living   1               Social History: History   Social History  . Marital Status: Single    Spouse Name: N/A    Number of Children: N/A  . Years of Education: N/A   Social History Main Topics  . Smoking status: Never Smoker   . Smokeless tobacco: Never Used  . Alcohol Use: No  . Drug Use: No  . Sexual Activity: Yes    Birth Control/ Protection: None   Other Topics Concern  . None   Social History Narrative    Family History: Family History  Problem Relation Age of Onset  . Aneurysm Mother     Allergies: No Known Allergies  Prescriptions prior to admission  Medication Sig Dispense Refill Last Dose  . Prenatal Vit-Fe Fumarate-FA (MULTIVITAMIN-PRENATAL) 27-0.8 MG TABS tablet Take 1 tablet by mouth daily at 12 noon.   06/17/2014 at Unknown time  . omeprazole (PRILOSEC) 20 MG capsule Take 1 capsule (20 mg total) by mouth 2 (two) times daily before a meal. (Patient not taking: Reported on 06/18/2014) 60 capsule 5 Not Taking     Review of Systems   All systems reviewed and negative except as stated in HPI  Blood pressure 146/99, pulse 90, temperature 98.6 F (37 C), resp. rate 16, height 5\' 9"  (1.753 m), weight 262 lb (118.842 kg), last menstrual period 09/15/2013, SpO2 100 %. General appearance: alert and cooperative Lungs: clear to auscultation  bilaterally Heart: regular rate and rhythm Abdomen: soft, non-tender; bowel sounds normal Extremities: Homans sign is negative, no sign of DV DTR 2+     Prenatal labs: ABO, Rh: O/POS/-- (07/29 1323) Antibody: NEG (07/29 1323) Rubella:   RPR: NON REAC (10/14 1203)  HBsAg: NEGATIVE (07/29 1323)  HIV: NONREACTIVE (10/14 1203)  GBS: NOT DETECTED (01/06 1439)  2 hr Glucola normal Genetic screening  negative    Results for orders placed or performed during the hospital encounter of 06/18/14 (from the past 24 hour(s))  Protein / creatinine ratio, urine   Collection Time: 06/18/14 11:30 AM  Result Value Ref Range   Creatinine, Urine 279.00 mg/dL   Total Protein, Urine 11 mg/dL   Protein Creatinine Ratio 0.04 0.00 - 0.15  Urinalysis, Routine w reflex microscopic   Collection Time: 06/18/14 11:30 AM  Result Value Ref Range   Color, Urine YELLOW YELLOW   APPearance CLEAR CLEAR   Specific Gravity, Urine 1.020 1.005 - 1.030   pH 6.5 5.0 - 8.0   Glucose, UA NEGATIVE NEGATIVE mg/dL   Hgb urine dipstick NEGATIVE NEGATIVE   Bilirubin Urine NEGATIVE NEGATIVE   Ketones, ur 15 (A) NEGATIVE mg/dL   Protein, ur NEGATIVE NEGATIVE mg/dL   Urobilinogen, UA 0.2 0.0 - 1.0 mg/dL   Nitrite NEGATIVE NEGATIVE   Leukocytes, UA  NEGATIVE NEGATIVE  CBC   Collection Time: 06/18/14 11:58 AM  Result Value Ref Range   WBC 9.7 4.0 - 10.5 K/uL   RBC 4.14 3.87 - 5.11 MIL/uL   Hemoglobin 11.3 (L) 12.0 - 15.0 g/dL   HCT 95.6 (L) 21.3 - 08.6 %   MCV 82.4 78.0 - 100.0 fL   MCH 27.3 26.0 - 34.0 pg   MCHC 33.1 30.0 - 36.0 g/dL   RDW 57.8 46.9 - 62.9 %   Platelets 164 150 - 400 K/uL  Comprehensive metabolic panel   Collection Time: 06/18/14 11:58 AM  Result Value Ref Range   Sodium 135 135 - 145 mmol/L   Potassium 3.8 3.5 - 5.1 mmol/L   Chloride 106 96 - 112 mmol/L   CO2 22 19 - 32 mmol/L   Glucose, Bld 85 70 - 99 mg/dL   BUN 10 6 - 23 mg/dL   Creatinine, Ser 5.28 0.50 - 1.10 mg/dL   Calcium 8.8 8.4  - 41.3 mg/dL   Total Protein 6.9 6.0 - 8.3 g/dL   Albumin 3.1 (L) 3.5 - 5.2 g/dL   AST 18 0 - 37 U/L   ALT 24 0 - 35 U/L   Alkaline Phosphatase 123 (H) 39 - 117 U/L   Total Bilirubin 0.3 0.3 - 1.2 mg/dL   GFR calc non Af Amer >90 >90 mL/min   GFR calc Af Amer >90 >90 mL/min   Anion gap 7 5 - 15  Uric acid   Collection Time: 06/18/14 11:58 AM  Result Value Ref Range   Uric Acid, Serum 4.0 2.4 - 7.0 mg/dL    There are no active problems to display for this patient.   Assessment: Lynn Gallegos is a 29 y.o. G1P0 at [redacted]w[redacted]d here for IOL 2/2 gHTN with normal preEclampsia labs  #Labor:cytotec for induction #Pain: Epidural upon request #FWB: Cat I #ID:  GBS neg   ACOSTA,KRISTY ROCIO 06/18/2014, 1:03 PM

## 2014-06-19 ENCOUNTER — Encounter: Payer: Self-pay | Admitting: Obstetrics

## 2014-06-19 LAB — HIV ANTIBODY (ROUTINE TESTING W REFLEX): HIV Screen 4th Generation wRfx: NONREACTIVE

## 2014-06-19 LAB — RPR: RPR Ser Ql: NONREACTIVE

## 2014-06-19 MED ORDER — OXYTOCIN 40 UNITS IN LACTATED RINGERS INFUSION - SIMPLE MED
1.0000 m[IU]/min | INTRAVENOUS | Status: DC
  Administered 2014-06-19: 1 m[IU]/min via INTRAVENOUS
  Filled 2014-06-19: qty 1000

## 2014-06-19 MED ORDER — LABETALOL HCL 200 MG PO TABS
200.0000 mg | ORAL_TABLET | Freq: Three times a day (TID) | ORAL | Status: DC
  Administered 2014-06-19 – 2014-06-20 (×3): 200 mg via ORAL
  Filled 2014-06-19 (×4): qty 1

## 2014-06-19 NOTE — Progress Notes (Signed)
Lynn Gallegos is a 29 y.o. G1P0 at 7258w4d by LMP admitted for induction of labor due to Hypertension.  Subjective:   Objective: BP 149/85 mmHg  Pulse 78  Temp(Src) 98 F (36.7 C) (Oral)  Resp 20  Ht 5\' 9"  (1.753 m)  Wt 262 lb (118.842 kg)  BMI 38.67 kg/m2  SpO2 100%  LMP 09/15/2013 I/O last 3 completed shifts: In: 625 [I.V.:625] Out: -     FHT:  FHR: 130 bpm, variability: moderate,  accelerations:  Present,  decelerations:  Absent UC:   regular, every 2-5 minutes, mild SVE:   Dilation: Fingertip Effacement (%): 50 Station: -3 Exam by:: [email or web address]  Labs: Lab Results  Component Value Date   WBC 10.1 06/18/2014   HGB 11.2* 06/18/2014   HCT 33.1* 06/18/2014   MCV 82.3 06/18/2014   PLT 172 06/18/2014    Assessment / Plan: 39 weeks.  Gestational HTN.  2 stage IOL.  No progress on pitocin.  Will stop pitocin and resume cervical ripening with Cytotec.  Labor: None Preeclampsia:  labs stable Fetal Wellbeing:  Category I Pain Control:  Labor support without medications I/D:  n/a Anticipated MOD:  NSVD  Lynn Gallegos A 06/19/2014, 5:39 PM

## 2014-06-19 NOTE — Progress Notes (Signed)
Subjective:    Lynn Gallegos is a 29 y.o. female being seen today for her obstetrical visit. She is at 7742w4d gestation. Patient reports no complaints. Fetal movement: normal.  Problem List Items Addressed This Visit    None    Visit Diagnoses    Encounter for supervision of normal first pregnancy in third trimester    -  Primary    Relevant Orders    POCT urinalysis dipstick      Patient Active Problem List   Diagnosis Date Noted  . PIH (pregnancy induced hypertension) 06/18/2014    Objective:    BP 134/95 mmHg  Pulse 84  Temp(Src) 98.2 F (36.8 C)  Wt 262 lb (118.842 kg)  LMP 09/15/2013 FHT:  140 BPM  Uterine Size: size equals dates  Presentation: cephalic  Pelvic Exam:              Dilation: Closed       Effacement: 50%   Station:  -3     Consistency: medium            Position: middle     Assessment:    Pregnancy @ 5642w4d  weeks    Elevated BP  Plan:    Postdates management: discussed fetal surveillance and induction, discussed fetal movement, NST reactive, biophysical profile ordered [facility] of elevated BP

## 2014-06-20 ENCOUNTER — Telehealth (HOSPITAL_COMMUNITY): Payer: Self-pay | Admitting: *Deleted

## 2014-06-20 MED ORDER — LABETALOL HCL 200 MG PO TABS: 200.0000 mg | ORAL_TABLET | Freq: Three times a day (TID) | ORAL | Status: DC

## 2014-06-20 NOTE — Discharge Instructions (Signed)
Patient information: Preeclampsia (The Basics)View in SpanishWritten by the doctors and editors at [employer]  What is preeclampsia? -- Preeclampsia is a dangerous condition that some women get when they are pregnant. It usually happens during the second half of pregnancy (after 20 weeks). It can also happen during labor or after the baby is born.  Women with preeclampsia have high blood pressure and too much protein in their urine. They can also have other serious problems, such as with their liver or kidney. Plus, the baby might not grow well and be small.  What are the symptoms of preeclampsia? -- Most women with preeclampsia do not feel any different than usual. Preeclampsia usually does not cause symptoms unless it is severe. Signs and symptoms of severe preeclampsia are: A bad headache  Changes in vision: blurry vision, flashes of light, spots  Urinating less often than usual  Nausea or vomiting  Belly pain, especially in the upper belly  Swelling of the face and hands  Sudden weight gain (more than 2 pounds in a week) If you have any of these symptoms, tell your doctor or nurse. You might not have preeclampsia, because these symptoms can also occur in normal pregnancies. But its important that your doctor know about them.  You should also call your doctor or nurse if you have bleeding from the vagina. How might preeclampsia affect my baby? -- Preeclampsia can: Slow the growth of the baby  Decrease the amount of amniotic fluid around the baby (amniotic fluid is the liquid that surrounds and protects the baby in the uterus) You should call your doctor or nurse if your baby is not moving as much as usual. Your doctor or nurse will do tests to check for any problems with the baby. Is there a test for preeclampsia? -- Yes. To test for preeclampsia, your doctor or nurse will take your blood pressure and check your urine for protein at each visit during pregnancy.  When your doctor or nurse tells  you your blood pressure, he or she will say 2 numbers. For instance, your doctor or nurse might say that your blood pressure is 140 over 90. To be diagnosed with preeclampsia, your top number (called systolic pressure) must be 140 or higher, or your bottom number (called diastolic pressure) must be 90 or higher. Plus, you must have too much protein in your urine. It is possible to have high blood pressure (above 140/90) during pregnancy without having high protein in the urine. That is not preeclampsia. Still, if you develop high blood pressure, your doctor will watch you closely. You could develop preeclampsia or other problems related to high blood pressure. How is preeclampsia treated? -- The only cure for preeclampsia is to deliver the baby. Your doctor or nurse will decide whether it is better for you to have your baby right away, or to wait. If you are near your due date, your doctor will probably give you medicine to start contractions. This is called inducing labor. Most women are able to give birth the usual way, through the vagina. But in some cases the doctor will need to do a C-section. A C-section, or cesarean delivery, is a type of surgery used to get the baby out of the uterus. If your due date is not for several weeks, and your preeclampsia is not severe, your doctor or nurse might wait to deliver your baby. This is to give the baby more time to grow and develop. If your doctor or nurse decides  to wait, he or she will check you and your baby often for any problems. You might need to stay in the hospital. If your blood pressure is very high, your doctor or nurse might give you medicine to lower blood pressure. This is to keep you from having a stroke. Women with preeclampsia can sometimes have seizures. Your doctor or nurse will probably give you medicine during labor to prevent this.  What can I do to prevent preeclampsia? -- You cant do anything to keep from getting preeclampsia.  The most important thing you can do is to keep all the appointments you have with your doctor, nurse, or midwife. That way, they can find out as soon as possible if your blood pressure goes up or if you have too much protein in your urine. Also, call them right away if you have symptoms of preeclampsia or the baby isnt moving as much as usual. Your doctor or nurse can do things to keep you from having worse problems from preeclampsia. More on this topic Patient information: High blood pressure in adults (The Basics) Patient information: C-section (cesarean delivery) (The Basics) Patient information: Swelling (The Basics) Patient information: High blood pressure and pregnancy (The Basics) Patient information: HELLP syndrome (The Basics) Patient information: Preeclampsia (Beyond the Basics) Patient information: High blood pressure in adults (Beyond the Basics) Patient information: High blood pressure treatment in adults (Beyond the Basics)

## 2014-06-20 NOTE — Discharge Summary (Signed)
Physician Discharge Summary  Patient ID: Lynn Gallegos MRN: [id number] DOB/AGE: 09-16-1985 29 y.o.  Admit date: 06/18/2014 Discharge date: 06/20/2014  Admission Diagnoses: Gestational Hypertension, IOL  Discharge Diagnoses: Same, Failed IOL Active Problems:   PIH (pregnancy induced hypertension)   Discharged Condition: good  Hospital Course: Admitted for IOL for PIH at [redacted] weeks gestation.  No progress with 2 stage IOL for 2 days.  BP, labs and baby were stable.  Induction stopped and patient discharged home for 72 hour rest.  IOL rescheduled for 06-23-14 at 0730.  Consults: None  Significant Diagnostic Studies: labs: CBC, CMET  Treatments: Labetalol  Discharge Exam: Blood pressure 132/64, pulse 75, temperature 98.2 F (36.8 C), temperature source Oral, resp. rate 18, height 5\' 9"  (1.753 m), weight 262 lb (118.842 kg), last menstrual period 09/15/2013, SpO2 100 %. General appearance: alert and no distress Resp: clear to auscultation bilaterally Cardio: regular rate and rhythm, S1, S2 normal, no murmur, click, rub or gallop GI: normal findings: soft, non-tender Extremities: extremities normal, atraumatic, no cyanosis or edema Cervix:  FT / 50% / -2 / VTX  Disposition: 01-Home or Self Care     Medication List    TAKE these medications        labetalol 200 MG tablet  Commonly known as:  NORMODYNE  Take 1 tablet (200 mg total) by mouth 3 (three) times daily.     multivitamin-prenatal 27-0.8 MG Tabs tablet  Take 1 tablet by mouth daily at 12 noon.     omeprazole 20 MG capsule  Commonly known as:  PRILOSEC  Take 1 capsule (20 mg total) by mouth 2 (two) times daily before a meal.           Follow-up Information    [facility].   Why:  For resumption of induction of labor      Signed: Draco Malczewski A 06/20/2014, 9:05 AM

## 2014-06-20 NOTE — Telephone Encounter (Signed)
Preadmission screen  

## 2014-06-20 NOTE — Progress Notes (Signed)
Lynn Gallegos is a 29 y.o. G1P0 at 2350w5d by LMP admitted for induction of labor due to Hypertension.  Subjective:   Objective: BP 132/64 mmHg  Pulse 75  Temp(Src) 98.2 F (36.8 C) (Oral)  Resp 18  Ht 5\' 9"  (1.753 m)  Wt 262 lb (118.842 kg)  BMI 38.67 kg/m2  SpO2 100%  LMP 09/15/2013 I/O last 3 completed shifts: In: 625 [I.V.:625] Out: -     FHT:  FHR: 130 bpm, variability: moderate,  accelerations:  Present,  decelerations:  Absent UC:   irregular, mild SVE:   Dilation: Fingertip Effacement (%): 50 Station: -2, -1 Exam by:: e. poore, rn  Labs: Lab Results  Component Value Date   WBC 10.1 06/18/2014   HGB 11.2* 06/18/2014   HCT 33.1* 06/18/2014   MCV 82.3 06/18/2014   PLT 172 06/18/2014    Assessment / Plan: 39.5 weeks.  PIH.  Stable.  Failed 2 stage IOL.  BP, labs and baby all stable.  Discussed management options with patient and we decided to discontinue IOL.  Rest for 72 hours, and resume IOL on Monday 05-23-14 at 0730.  Labor: none Preeclampsia:  labs stable and BP stable on Labetalol Fetal Wellbeing:  Category I Pain Control:  Labor support without medications I/D:  n/a Anticipated MOD:  NSVD  Lynn Gallegos A 06/20/2014, 8:52 AM

## 2014-06-23 ENCOUNTER — Inpatient Hospital Stay (HOSPITAL_COMMUNITY)
Admission: RE | Admit: 2014-06-23 | Discharge: 2014-06-29 | DRG: 766 | Disposition: A | Discharge status: Home / Self Care | Payer: Medicaid Other | Source: Ambulatory Visit | Attending: Obstetrics | Admitting: Obstetrics

## 2014-06-23 ENCOUNTER — Encounter (HOSPITAL_COMMUNITY): Payer: Self-pay

## 2014-06-23 ENCOUNTER — Inpatient Hospital Stay (HOSPITAL_COMMUNITY): Payer: Medicaid Other

## 2014-06-23 DIAGNOSIS — O99214 Obesity complicating childbirth: Secondary | ICD-10-CM | POA: Diagnosis present

## 2014-06-23 DIAGNOSIS — O139 Gestational [pregnancy-induced] hypertension without significant proteinuria, unspecified trimester: Secondary | ICD-10-CM

## 2014-06-23 DIAGNOSIS — Z3A40 40 weeks gestation of pregnancy: Secondary | ICD-10-CM | POA: Diagnosis present

## 2014-06-23 DIAGNOSIS — Z3490 Encounter for supervision of normal pregnancy, unspecified, unspecified trimester: Secondary | ICD-10-CM

## 2014-06-23 DIAGNOSIS — Z3689 Encounter for other specified antenatal screening: Secondary | ICD-10-CM | POA: Insufficient documentation

## 2014-06-23 DIAGNOSIS — Z6838 Body mass index (BMI) 38.0-38.9, adult: Secondary | ICD-10-CM

## 2014-06-23 DIAGNOSIS — O133 Gestational [pregnancy-induced] hypertension without significant proteinuria, third trimester: Secondary | ICD-10-CM | POA: Diagnosis present

## 2014-06-23 DIAGNOSIS — Z98891 History of uterine scar from previous surgery: Secondary | ICD-10-CM

## 2014-06-23 LAB — TYPE AND SCREEN
ABO/RH(D): O POS
ANTIBODY SCREEN: NEGATIVE

## 2014-06-23 LAB — CBC
HEMATOCRIT: 31.1 % — AB (ref 36.0–46.0)
Hemoglobin: 10.6 g/dL — ABNORMAL LOW (ref 12.0–15.0)
MCH: 27.7 pg (ref 26.0–34.0)
MCHC: 34.1 g/dL (ref 30.0–36.0)
MCV: 81.4 fL (ref 78.0–100.0)
Platelets: 182 10*3/uL (ref 150–400)
RBC: 3.82 MIL/uL — ABNORMAL LOW (ref 3.87–5.11)
RDW: 13.4 % (ref 11.5–15.5)
WBC: 8.3 10*3/uL (ref 4.0–10.5)

## 2014-06-23 MED ORDER — OXYCODONE-ACETAMINOPHEN 5-325 MG PO TABS: 1.0000 | ORAL_TABLET | ORAL | Status: DC | PRN

## 2014-06-23 MED ORDER — ACETAMINOPHEN 325 MG PO TABS
650.0000 mg | ORAL_TABLET | ORAL | Status: DC | PRN
  Administered 2014-06-24: 650 mg via ORAL
  Filled 2014-06-23: qty 2

## 2014-06-23 MED ORDER — ONDANSETRON HCL 4 MG/2ML IJ SOLN
4.0000 mg | Freq: Four times a day (QID) | INTRAMUSCULAR | Status: DC | PRN
  Administered 2014-06-25: 4 mg via INTRAVENOUS
  Filled 2014-06-23: qty 2

## 2014-06-23 MED ORDER — CITRIC ACID-SODIUM CITRATE 334-500 MG/5ML PO SOLN
30.0000 mL | ORAL | Status: DC | PRN
  Administered 2014-06-26: 30 mL via ORAL
  Filled 2014-06-23: qty 15

## 2014-06-23 MED ORDER — TERBUTALINE SULFATE 1 MG/ML IJ SOLN: 0.2500 mg | Freq: Once | INTRAMUSCULAR | Status: AC | PRN

## 2014-06-23 MED ORDER — OXYTOCIN 40 UNITS IN LACTATED RINGERS INFUSION - SIMPLE MED: 62.5000 mL/h | INTRAVENOUS | Status: DC

## 2014-06-23 MED ORDER — OXYCODONE-ACETAMINOPHEN 5-325 MG PO TABS: 2.0000 | ORAL_TABLET | ORAL | Status: DC | PRN

## 2014-06-23 MED ORDER — LACTATED RINGERS IV SOLN
INTRAVENOUS | Status: DC
  Administered 2014-06-23 – 2014-06-25 (×4): via INTRAVENOUS
  Administered 2014-06-25: 1000 mL via INTRAVENOUS
  Administered 2014-06-25 – 2014-06-26 (×3): via INTRAVENOUS

## 2014-06-23 MED ORDER — LACTATED RINGERS IV SOLN
500.0000 mL | INTRAVENOUS | Status: DC | PRN
  Administered 2014-06-25 – 2014-06-26 (×2): 500 mL via INTRAVENOUS

## 2014-06-23 MED ORDER — MISOPROSTOL 200 MCG PO TABS
50.0000 ug | ORAL_TABLET | ORAL | Status: DC
  Administered 2014-06-23 – 2014-06-25 (×5): 50 ug via ORAL
  Filled 2014-06-23 (×5): qty 0.5

## 2014-06-23 MED ORDER — LABETALOL HCL 200 MG PO TABS
200.0000 mg | ORAL_TABLET | Freq: Three times a day (TID) | ORAL | Status: DC
  Administered 2014-06-23 – 2014-06-26 (×10): 200 mg via ORAL
  Filled 2014-06-23 (×12): qty 1

## 2014-06-23 MED ORDER — OXYTOCIN BOLUS FROM INFUSION: 500.0000 mL | INTRAVENOUS | Status: DC

## 2014-06-23 MED ORDER — FLEET ENEMA 7-19 GM/118ML RE ENEM: 1.0000 | ENEMA | Freq: Every day | RECTAL | Status: DC | PRN

## 2014-06-23 MED ORDER — LIDOCAINE HCL (PF) 1 % IJ SOLN
30.0000 mL | INTRAMUSCULAR | Status: DC | PRN
  Filled 2014-06-23: qty 30

## 2014-06-23 NOTE — H&P (Signed)
Lynn Gallegos is a 29 y.o. female presenting for IOL for PIH. Maternal Medical History:  Fetal activity: Perceived fetal activity is normal.    Prenatal complications: PIH.   Prenatal Complications - Diabetes: none.    OB History    Gravida Para Term Preterm AB TAB SAB Ectopic Multiple Living   1              Past Medical History  Diagnosis Date  . Hypertension    Past Surgical History  Procedure Laterality Date  . No past surgeries     Family History: family history includes Aneurysm in her mother. Social History:  reports that she has never smoked. She has never used smokeless tobacco. She reports that she does not drink alcohol or use illicit drugs.   Prenatal Transfer Tool  Maternal Diabetes: No Genetic Screening: Normal Maternal Ultrasounds/Referrals: Normal Fetal Ultrasounds or other Referrals:  None Maternal Substance Abuse:  No Significant Maternal Medications:  Labetalol Significant Maternal Lab Results:  None Other Comments:  None  Review of Systems  All other systems reviewed and are negative.   Dilation: Fingertip Effacement (%): 50 Station: -2 Exam by:: Marijean HeathM. Robinson, RN Blood pressure 144/93, pulse 81, temperature 98.2 F (36.8 C), temperature source Oral, resp. rate 14, height 5\' 9"  (1.753 m), weight 262 lb (118.842 kg), last menstrual period 09/15/2013. Maternal Exam:  Abdomen: Patient reports no abdominal tenderness. Fetal presentation: vertex  Cervix: Cervix evaluated by digital exam.     Physical Exam  Nursing note and vitals reviewed. Constitutional: She is oriented to person, place, and time. She appears well-developed and well-nourished.  HENT:  Head: Normocephalic and atraumatic.  Eyes: Conjunctivae are normal. Pupils are equal, round, and reactive to light.  Neck: Normal range of motion. Neck supple.  Cardiovascular: Normal rate and regular rhythm.   Respiratory: Effort normal and breath sounds normal.  GI: Soft. Bowel sounds are  normal.  Genitourinary: Vagina normal and uterus normal.  Musculoskeletal: Normal range of motion.  Neurological: She is alert and oriented to person, place, and time.  Skin: Skin is warm and dry.  Psychiatric: She has a normal mood and affect. Her behavior is normal. Judgment and thought content normal.    Prenatal labs: ABO, Rh: --/--/O POS (02/08 0825) Antibody: NEG (02/08 0825) Rubella: 2.94 (07/29 1323) RPR: Non Reactive (02/03 1405)  HBsAg: NEGATIVE (07/29 1323)  HIV: NONREACTIVE (10/14 1203)  GBS: NOT DETECTED (01/06 1439)   Assessment/Plan: 40 weeks.  PIH.  2 stage IOL.   Lynn Gallegos A 06/23/2014, 3:48 PM

## 2014-06-24 LAB — RPR: RPR: NONREACTIVE

## 2014-06-24 MED ORDER — OXYTOCIN 40 UNITS IN LACTATED RINGERS INFUSION - SIMPLE MED
1.0000 m[IU]/min | INTRAVENOUS | Status: DC
  Administered 2014-06-24: 2 m[IU]/min via INTRAVENOUS
  Filled 2014-06-24: qty 1000

## 2014-06-24 MED ORDER — NALBUPHINE HCL 10 MG/ML IJ SOLN
10.0000 mg | Freq: Once | INTRAMUSCULAR | Status: AC
  Administered 2014-06-24: 10 mg via INTRAVENOUS
  Filled 2014-06-24: qty 1

## 2014-06-24 MED ORDER — OXYTOCIN 40 UNITS IN LACTATED RINGERS INFUSION - SIMPLE MED
1.0000 m[IU]/min | INTRAVENOUS | Status: DC
  Administered 2014-06-25: 5 m[IU]/min via INTRAVENOUS
  Administered 2014-06-25: 2 m[IU]/min via INTRAVENOUS
  Administered 2014-06-25: 6 m[IU]/min via INTRAVENOUS
  Administered 2014-06-25: 4 m[IU]/min via INTRAVENOUS

## 2014-06-24 MED ORDER — MISOPROSTOL 25 MCG QUARTER TABLET
25.0000 ug | ORAL_TABLET | ORAL | Status: DC
Start: 2014-06-24 — End: 2014-06-26
  Filled 2014-06-24 (×4): qty 1

## 2014-06-24 MED ORDER — NALBUPHINE HCL 10 MG/ML IJ SOLN
10.0000 mg | INTRAMUSCULAR | Status: DC | PRN
  Administered 2014-06-24: 10 mg via INTRAVENOUS
  Filled 2014-06-24: qty 1

## 2014-06-24 MED ORDER — TERBUTALINE SULFATE 1 MG/ML IJ SOLN: 0.2500 mg | Freq: Once | INTRAMUSCULAR | Status: AC | PRN

## 2014-06-24 NOTE — Progress Notes (Signed)
Lorin Glassoni Zea is a 29 y.o. G1P0 at 3185w2d by LMP admitted for induction of labor due to Hypertension.  Subjective:   Objective: BP 146/95 mmHg  Pulse 72  Temp(Src) 98.7 F (37.1 C) (Oral)  Resp 16  Ht 5\' 9"  (1.753 m)  Wt 262 lb (118.842 kg)  BMI 38.67 kg/m2  LMP 09/15/2013      FHT:  FHR: 120-130 bpm, variability: moderate,  accelerations:  Present,  decelerations:  Absent UC:   Irregular, mild SVE:   Dilation: Fingertip Effacement (%): 20 Station: -3 Exam by:: lee  Labs: Lab Results  Component Value Date   WBC 8.3 06/23/2014   HGB 10.6* 06/23/2014   HCT 31.1* 06/23/2014   MCV 81.4 06/23/2014   PLT 182 06/23/2014    Assessment / Plan: 40.2 weeks.  Hypertension.  2 stage IOL.  No progress with foley bulb and pitocin.  Patient and fetus doing well.  Discussed lack of progress with patient and she indicated that she does not want a C/S.  We will continue cervical ripening with Cytotec.  Labor: None Preeclampsia:  n/a Fetal Wellbeing:  Category I Pain Control:  Nubain I/D:  n/a Anticipated MOD:  NSVD  HARPER,CHARLES A 06/24/2014, 8:05 PM

## 2014-06-25 ENCOUNTER — Encounter: Payer: Medicaid Other | Admitting: Obstetrics

## 2014-06-25 ENCOUNTER — Inpatient Hospital Stay (HOSPITAL_COMMUNITY): Payer: Medicaid Other | Admitting: Anesthesiology

## 2014-06-25 LAB — CBC
HEMATOCRIT: 34.1 % — AB (ref 36.0–46.0)
Hemoglobin: 11.4 g/dL — ABNORMAL LOW (ref 12.0–15.0)
MCH: 27.9 pg (ref 26.0–34.0)
MCHC: 33.4 g/dL (ref 30.0–36.0)
MCV: 83.6 fL (ref 78.0–100.0)
Platelets: 167 10*3/uL (ref 150–400)
RBC: 4.08 MIL/uL (ref 3.87–5.11)
RDW: 13.3 % (ref 11.5–15.5)
WBC: 11.9 10*3/uL — ABNORMAL HIGH (ref 4.0–10.5)

## 2014-06-25 MED ORDER — OXYTOCIN 40 UNITS IN LACTATED RINGERS INFUSION - SIMPLE MED: 1.0000 m[IU]/min | INTRAVENOUS | Status: DC

## 2014-06-25 MED ORDER — EPHEDRINE 5 MG/ML INJ: 10.0000 mg | INTRAVENOUS | Status: DC | PRN

## 2014-06-25 MED ORDER — PHENYLEPHRINE 40 MCG/ML (10ML) SYRINGE FOR IV PUSH (FOR BLOOD PRESSURE SUPPORT)
80.0000 ug | PREFILLED_SYRINGE | INTRAVENOUS | Status: DC | PRN
  Filled 2014-06-25: qty 20

## 2014-06-25 MED ORDER — FENTANYL 2.5 MCG/ML BUPIVACAINE 1/10 % EPIDURAL INFUSION (WH - ANES): 14.0000 mL/h | INTRAMUSCULAR | Status: DC | PRN

## 2014-06-25 MED ORDER — PHENYLEPHRINE 40 MCG/ML (10ML) SYRINGE FOR IV PUSH (FOR BLOOD PRESSURE SUPPORT): 80.0000 ug | PREFILLED_SYRINGE | INTRAVENOUS | Status: DC | PRN

## 2014-06-25 MED ORDER — BUTORPHANOL TARTRATE 1 MG/ML IJ SOLN
INTRAMUSCULAR | Status: AC
  Filled 2014-06-25: qty 1

## 2014-06-25 MED ORDER — DIPHENHYDRAMINE HCL 50 MG/ML IJ SOLN: 12.5000 mg | INTRAMUSCULAR | Status: DC | PRN

## 2014-06-25 MED ORDER — FENTANYL 2.5 MCG/ML BUPIVACAINE 1/10 % EPIDURAL INFUSION (WH - ANES)
14.0000 mL/h | INTRAMUSCULAR | Status: DC | PRN
  Administered 2014-06-25 – 2014-06-26 (×4): 14 mL/h via EPIDURAL
  Filled 2014-06-25 (×4): qty 125

## 2014-06-25 MED ORDER — LACTATED RINGERS IV SOLN: 500.0000 mL | Freq: Once | INTRAVENOUS | Status: DC

## 2014-06-25 MED ORDER — LIDOCAINE HCL (PF) 1 % IJ SOLN
INTRAMUSCULAR | Status: DC | PRN
  Administered 2014-06-25 (×2): 5 mL

## 2014-06-25 MED ORDER — BUTORPHANOL TARTRATE 1 MG/ML IJ SOLN
1.0000 mg | INTRAMUSCULAR | Status: DC | PRN
  Administered 2014-06-25 (×2): 2 mg via INTRAVENOUS
  Filled 2014-06-25: qty 2
  Filled 2014-06-25: qty 1

## 2014-06-25 NOTE — Progress Notes (Signed)
Pt's support person questioning Dr Clearance CootsHarper about AROM . Dr Clearance CootsHarper explains need to del due HTN. FOB verbalizes concern over AROM due to pt discomfort.

## 2014-06-25 NOTE — Anesthesia Procedure Notes (Signed)
Epidural Patient location during procedure: OB Start time: 06/25/2014 6:33 PM  Staffing Anesthesiologist: Brayton CavesJACKSON, Tania Steinhauser Performed by: anesthesiologist   Preanesthetic Checklist Completed: patient identified, site marked, surgical consent, pre-op evaluation, timeout performed, IV checked, risks and benefits discussed and monitors and equipment checked  Epidural Patient position: sitting Prep: site prepped and draped and DuraPrep Patient monitoring: continuous pulse ox and blood pressure Approach: midline Location: L3-L4 Injection technique: LOR air  Needle:  Needle type: Tuohy  Needle gauge: 17 G Needle length: 9 cm and 9 Needle insertion depth: 8 cm Catheter type: closed end flexible Catheter size: 19 Gauge Catheter at skin depth: 14 cm Test dose: negative  Assessment Events: blood not aspirated, injection not painful, no injection resistance, negative IV test and no paresthesia  Additional Notes Patient identified.  Risk benefits discussed including failed block, incomplete pain control, headache, nerve damage, paralysis, blood pressure changes, nausea, vomiting, reactions to medication both toxic or allergic, and postpartum back pain.  Patient expressed understanding and wished to proceed.  All questions were answered.  Sterile technique used throughout procedure and epidural site dressed with sterile barrier dressing. No paresthesia or other complications noted.The patient did not experience any signs of intravascular injection such as tinnitus or metallic taste in mouth nor signs of intrathecal spread such as rapid motor block. Please see nursing notes for vital signs.

## 2014-06-25 NOTE — Progress Notes (Signed)
Lynn Gallegos is a 29 y.o. G1P0 at 7743w3d by LMP admitted for induction of labor due to Hypertension.  Subjective:   Objective: BP 129/72 mmHg  Pulse 71  Temp(Src) 97.6 F (36.4 C) (Axillary)  Resp 18  Ht 5\' 9"  (1.753 m)  Wt 262 lb (118.842 kg)  BMI 38.67 kg/m2  LMP 09/15/2013      FHT:  FHR: 120-130 bpm, variability: moderate,  accelerations:  Present,  decelerations:  Absent UC:   regular, every 5-8 minutes SVE:   Dilation: 2 Effacement (%): 70 Station: -2 Exam by:: Dhderr rn  Labs: Lab Results  Component Value Date   WBC 8.3 06/23/2014   HGB 10.6* 06/23/2014   HCT 31.1* 06/23/2014   MCV 81.4 06/23/2014   PLT 182 06/23/2014    Assessment / Plan: Induction of labor due to gestational hypertension,  progressing well on pitocin  Labor: Latent phase Preeclampsia:  stable BP and labs Fetal Wellbeing:  Category I Pain Control:  Stadol I/D:  n/a Anticipated MOD:  NSVD  HARPER,CHARLES A 06/25/2014, 2:40 PM

## 2014-06-25 NOTE — Anesthesia Preprocedure Evaluation (Signed)
Anesthesia Evaluation  Patient identified by MRN, date of birth, ID band Patient awake    Reviewed: Allergy & Precautions, H&P , Patient's Chart, lab work & pertinent test results  Airway Mallampati: III TM Distance: >3 FB Neck ROM: full    Dental   Pulmonary  breath sounds clear to auscultation        Cardiovascular hypertension, Rhythm:regular Rate:Normal     Neuro/Psych    GI/Hepatic   Endo/Other  Morbid obesity  Renal/GU      Musculoskeletal   Abdominal   Peds  Hematology   Anesthesia Other Findings   Reproductive/Obstetrics (+) Pregnancy                           Anesthesia Physical Anesthesia Plan  ASA: III  Anesthesia Plan: Epidural   Post-op Pain Management:    Induction:   Airway Management Planned:   Additional Equipment:   Intra-op Plan:   Post-operative Plan:   Informed Consent: I have reviewed the patients History and Physical, chart, labs and discussed the procedure including the risks, benefits and alternatives for the proposed anesthesia with the patient or authorized representative who has indicated his/her understanding and acceptance.     Plan Discussed with:   Anesthesia Plan Comments:         Anesthesia Quick Evaluation  

## 2014-06-26 ENCOUNTER — Encounter (HOSPITAL_COMMUNITY): Payer: Self-pay

## 2014-06-26 ENCOUNTER — Encounter (HOSPITAL_COMMUNITY)
Admission: RE | Disposition: A | Discharge status: Home / Self Care | Payer: Self-pay | Source: Ambulatory Visit | Attending: Obstetrics

## 2014-06-26 DIAGNOSIS — Z98891 History of uterine scar from previous surgery: Secondary | ICD-10-CM

## 2014-06-26 LAB — CBC
HCT: 31.2 % — ABNORMAL LOW (ref 36.0–46.0)
Hemoglobin: 10.4 g/dL — ABNORMAL LOW (ref 12.0–15.0)
MCH: 27.6 pg (ref 26.0–34.0)
MCHC: 33.3 g/dL (ref 30.0–36.0)
MCV: 82.8 fL (ref 78.0–100.0)
Platelets: 157 10*3/uL (ref 150–400)
RBC: 3.77 MIL/uL — ABNORMAL LOW (ref 3.87–5.11)
RDW: 13.2 % (ref 11.5–15.5)
WBC: 18.9 10*3/uL — ABNORMAL HIGH (ref 4.0–10.5)

## 2014-06-26 SURGERY — Surgical Case
Anesthesia: Epidural

## 2014-06-26 MED ORDER — IBUPROFEN 600 MG PO TABS
600.0000 mg | ORAL_TABLET | Freq: Four times a day (QID) | ORAL | Status: DC
  Administered 2014-06-27 – 2014-06-29 (×10): 600 mg via ORAL
  Filled 2014-06-26 (×10): qty 1

## 2014-06-26 MED ORDER — ACETAMINOPHEN 500 MG PO TABS
1000.0000 mg | ORAL_TABLET | Freq: Once | ORAL | Status: AC
  Administered 2014-06-26: 1000 mg via ORAL
  Filled 2014-06-26: qty 2

## 2014-06-26 MED ORDER — PHENYLEPHRINE 40 MCG/ML (10ML) SYRINGE FOR IV PUSH (FOR BLOOD PRESSURE SUPPORT)
PREFILLED_SYRINGE | INTRAVENOUS | Status: AC
  Filled 2014-06-26: qty 10

## 2014-06-26 MED ORDER — ACETAMINOPHEN 325 MG PO TABS: 325.0000 mg | ORAL_TABLET | ORAL | Status: DC | PRN

## 2014-06-26 MED ORDER — MEPERIDINE HCL 25 MG/ML IJ SOLN
INTRAMUSCULAR | Status: DC | PRN
  Administered 2014-06-26: 25 mg via INTRAVENOUS

## 2014-06-26 MED ORDER — MORPHINE SULFATE 0.5 MG/ML IJ SOLN
INTRAMUSCULAR | Status: AC
  Filled 2014-06-26: qty 10

## 2014-06-26 MED ORDER — MENTHOL 3 MG MT LOZG: 1.0000 | LOZENGE | OROMUCOSAL | Status: DC | PRN

## 2014-06-26 MED ORDER — DIPHENHYDRAMINE HCL 50 MG/ML IJ SOLN: 12.5000 mg | INTRAMUSCULAR | Status: DC | PRN

## 2014-06-26 MED ORDER — LACTATED RINGERS IV SOLN
INTRAVENOUS | Status: DC | PRN
  Administered 2014-06-26 (×2): via INTRAVENOUS

## 2014-06-26 MED ORDER — NALBUPHINE HCL 10 MG/ML IJ SOLN: 5.0000 mg | Freq: Once | INTRAMUSCULAR | Status: AC | PRN

## 2014-06-26 MED ORDER — KETOROLAC TROMETHAMINE 30 MG/ML IJ SOLN
30.0000 mg | Freq: Four times a day (QID) | INTRAMUSCULAR | Status: DC | PRN
Start: 2014-06-26 — End: 2014-06-26

## 2014-06-26 MED ORDER — WITCH HAZEL-GLYCERIN EX PADS
1.0000 | MEDICATED_PAD | CUTANEOUS | Status: DC | PRN
Start: 2014-06-26 — End: 2014-06-29

## 2014-06-26 MED ORDER — IBUPROFEN 600 MG PO TABS: 600.0000 mg | ORAL_TABLET | Freq: Four times a day (QID) | ORAL | Status: DC | PRN

## 2014-06-26 MED ORDER — DIBUCAINE 1 % RE OINT: 1.0000 "application " | TOPICAL_OINTMENT | RECTAL | Status: DC | PRN

## 2014-06-26 MED ORDER — DIPHENHYDRAMINE HCL 25 MG PO CAPS: 25.0000 mg | ORAL_CAPSULE | ORAL | Status: DC | PRN

## 2014-06-26 MED ORDER — LACTATED RINGERS IV SOLN
INTRAVENOUS | Status: DC
  Administered 2014-06-27 (×3): via INTRAVENOUS

## 2014-06-26 MED ORDER — OXYTOCIN 10 UNIT/ML IJ SOLN
40.0000 [IU] | INTRAVENOUS | Status: DC | PRN
  Administered 2014-06-26: 40 [IU] via INTRAVENOUS

## 2014-06-26 MED ORDER — SIMETHICONE 80 MG PO CHEW
80.0000 mg | CHEWABLE_TABLET | ORAL | Status: DC
  Administered 2014-06-27 – 2014-06-28 (×3): 80 mg via ORAL
  Filled 2014-06-26 (×2): qty 1

## 2014-06-26 MED ORDER — ONDANSETRON HCL 4 MG/2ML IJ SOLN
INTRAMUSCULAR | Status: DC | PRN
  Administered 2014-06-26: 4 mg via INTRAVENOUS

## 2014-06-26 MED ORDER — ONDANSETRON HCL 4 MG/2ML IJ SOLN: 4.0000 mg | INTRAMUSCULAR | Status: DC | PRN

## 2014-06-26 MED ORDER — OXYTOCIN 10 UNIT/ML IJ SOLN
INTRAMUSCULAR | Status: AC
  Filled 2014-06-26: qty 4

## 2014-06-26 MED ORDER — ONDANSETRON HCL 4 MG/2ML IJ SOLN
INTRAMUSCULAR | Status: AC
  Filled 2014-06-26: qty 2

## 2014-06-26 MED ORDER — MEPERIDINE HCL 25 MG/ML IJ SOLN: 6.2500 mg | INTRAMUSCULAR | Status: DC | PRN

## 2014-06-26 MED ORDER — TETANUS-DIPHTH-ACELL PERTUSSIS 5-2.5-18.5 LF-MCG/0.5 IM SUSP: 0.5000 mL | Freq: Once | INTRAMUSCULAR | Status: DC

## 2014-06-26 MED ORDER — NALOXONE HCL 1 MG/ML IJ SOLN
1.0000 ug/kg/h | INTRAVENOUS | Status: DC | PRN
  Filled 2014-06-26: qty 2

## 2014-06-26 MED ORDER — ACETAMINOPHEN 500 MG PO TABS
1000.0000 mg | ORAL_TABLET | Freq: Four times a day (QID) | ORAL | Status: AC
  Administered 2014-06-27 (×3): 1000 mg via ORAL
  Filled 2014-06-26 (×4): qty 2

## 2014-06-26 MED ORDER — DEXTROSE 5 % IV SOLN
2.0000 g | Freq: Once | INTRAVENOUS | Status: AC
  Administered 2014-06-26: 2 g via INTRAVENOUS
  Filled 2014-06-26: qty 2

## 2014-06-26 MED ORDER — LIDOCAINE-EPINEPHRINE (PF) 2 %-1:200000 IJ SOLN
INTRAMUSCULAR | Status: DC | PRN
  Administered 2014-06-26 (×2): 5 mL via EPIDURAL
  Administered 2014-06-26: 10 mL via EPIDURAL

## 2014-06-26 MED ORDER — SIMETHICONE 80 MG PO CHEW
80.0000 mg | CHEWABLE_TABLET | ORAL | Status: DC | PRN
Start: 2014-06-26 — End: 2014-06-29

## 2014-06-26 MED ORDER — PRENATAL MULTIVITAMIN CH
1.0000 | ORAL_TABLET | Freq: Every day | ORAL | Status: DC
  Administered 2014-06-27 – 2014-06-29 (×3): 1 via ORAL
  Filled 2014-06-26 (×3): qty 1

## 2014-06-26 MED ORDER — OXYTOCIN 40 UNITS IN LACTATED RINGERS INFUSION - SIMPLE MED: 62.5000 mL/h | INTRAVENOUS | Status: AC

## 2014-06-26 MED ORDER — LANOLIN HYDROUS EX OINT: 1.0000 "application " | TOPICAL_OINTMENT | CUTANEOUS | Status: DC | PRN

## 2014-06-26 MED ORDER — PROMETHAZINE HCL 25 MG/ML IJ SOLN: 6.2500 mg | INTRAMUSCULAR | Status: DC | PRN

## 2014-06-26 MED ORDER — BUPIVACAINE HCL (PF) 0.25 % IJ SOLN
INTRAMUSCULAR | Status: AC
  Filled 2014-06-26: qty 10

## 2014-06-26 MED ORDER — NALBUPHINE HCL 10 MG/ML IJ SOLN: 5.0000 mg | INTRAMUSCULAR | Status: DC | PRN

## 2014-06-26 MED ORDER — ZOLPIDEM TARTRATE 5 MG PO TABS: 5.0000 mg | ORAL_TABLET | Freq: Every evening | ORAL | Status: DC | PRN

## 2014-06-26 MED ORDER — DEXTROSE 5 % IV SOLN
2.0000 g | Freq: Four times a day (QID) | INTRAVENOUS | Status: DC
  Administered 2014-06-27 – 2014-06-29 (×10): 2 g via INTRAVENOUS
  Filled 2014-06-26 (×11): qty 2

## 2014-06-26 MED ORDER — MIDAZOLAM HCL 2 MG/2ML IJ SOLN: 0.5000 mg | Freq: Once | INTRAMUSCULAR | Status: DC | PRN

## 2014-06-26 MED ORDER — DEXTROSE 5 % IV SOLN
2.0000 g | Freq: Four times a day (QID) | INTRAVENOUS | Status: DC
  Administered 2014-06-26 (×2): 2 g via INTRAVENOUS
  Filled 2014-06-26 (×3): qty 2

## 2014-06-26 MED ORDER — ONDANSETRON HCL 4 MG PO TABS: 4.0000 mg | ORAL_TABLET | ORAL | Status: DC | PRN

## 2014-06-26 MED ORDER — FENTANYL CITRATE 0.05 MG/ML IJ SOLN
25.0000 ug | INTRAMUSCULAR | Status: DC | PRN
Start: 2014-06-26 — End: 2014-06-26

## 2014-06-26 MED ORDER — NALOXONE HCL 0.4 MG/ML IJ SOLN: 0.4000 mg | INTRAMUSCULAR | Status: DC | PRN

## 2014-06-26 MED ORDER — SCOPOLAMINE 1 MG/3DAYS TD PT72
MEDICATED_PATCH | TRANSDERMAL | Status: AC
  Filled 2014-06-26: qty 1

## 2014-06-26 MED ORDER — SODIUM CHLORIDE 0.9 % IJ SOLN: 3.0000 mL | INTRAMUSCULAR | Status: DC | PRN

## 2014-06-26 MED ORDER — PHENYLEPHRINE HCL 10 MG/ML IJ SOLN
INTRAMUSCULAR | Status: DC | PRN
  Administered 2014-06-26: 40 ug via INTRAVENOUS
  Administered 2014-06-26 (×4): 80 ug via INTRAVENOUS
  Administered 2014-06-26: 40 ug via INTRAVENOUS

## 2014-06-26 MED ORDER — ACETAMINOPHEN 160 MG/5ML PO SOLN: 325.0000 mg | ORAL | Status: DC | PRN

## 2014-06-26 MED ORDER — MEPERIDINE HCL 25 MG/ML IJ SOLN
INTRAMUSCULAR | Status: AC
  Filled 2014-06-26: qty 1

## 2014-06-26 MED ORDER — KETOROLAC TROMETHAMINE 30 MG/ML IJ SOLN
INTRAMUSCULAR | Status: AC
  Filled 2014-06-26: qty 1

## 2014-06-26 MED ORDER — SENNOSIDES-DOCUSATE SODIUM 8.6-50 MG PO TABS
2.0000 | ORAL_TABLET | ORAL | Status: DC
  Administered 2014-06-27: 2 via ORAL
  Filled 2014-06-26: qty 2

## 2014-06-26 MED ORDER — SCOPOLAMINE 1 MG/3DAYS TD PT72
1.0000 | MEDICATED_PATCH | Freq: Once | TRANSDERMAL | Status: DC
  Administered 2014-06-26: 1.5 mg via TRANSDERMAL

## 2014-06-26 MED ORDER — ONDANSETRON HCL 4 MG/2ML IJ SOLN: 4.0000 mg | Freq: Three times a day (TID) | INTRAMUSCULAR | Status: DC | PRN

## 2014-06-26 MED ORDER — KETOROLAC TROMETHAMINE 30 MG/ML IJ SOLN: 30.0000 mg | Freq: Once | INTRAMUSCULAR | Status: DC | PRN

## 2014-06-26 MED ORDER — DIPHENHYDRAMINE HCL 25 MG PO CAPS: 25.0000 mg | ORAL_CAPSULE | Freq: Four times a day (QID) | ORAL | Status: DC | PRN

## 2014-06-26 MED ORDER — OXYCODONE-ACETAMINOPHEN 5-325 MG PO TABS
1.0000 | ORAL_TABLET | ORAL | Status: DC | PRN
  Administered 2014-06-27 – 2014-06-29 (×6): 1 via ORAL
  Filled 2014-06-26 (×6): qty 1

## 2014-06-26 MED ORDER — MORPHINE SULFATE (PF) 0.5 MG/ML IJ SOLN
INTRAMUSCULAR | Status: DC | PRN
  Administered 2014-06-26: 4 mg via EPIDURAL
  Administered 2014-06-26: 1 mg via INTRAVENOUS

## 2014-06-26 MED ORDER — OXYCODONE-ACETAMINOPHEN 5-325 MG PO TABS
2.0000 | ORAL_TABLET | ORAL | Status: DC | PRN
  Filled 2014-06-26: qty 2

## 2014-06-26 MED ORDER — LACTATED RINGERS IV SOLN: INTRAVENOUS | Status: DC

## 2014-06-26 MED ORDER — LACTATED RINGERS IV SOLN
INTRAVENOUS | Status: DC | PRN
  Administered 2014-06-26: 21:00:00 via INTRAVENOUS

## 2014-06-26 MED ORDER — KETOROLAC TROMETHAMINE 30 MG/ML IJ SOLN
30.0000 mg | Freq: Four times a day (QID) | INTRAMUSCULAR | Status: DC | PRN
  Administered 2014-06-26: 30 mg via INTRAVENOUS

## 2014-06-26 MED ORDER — SIMETHICONE 80 MG PO CHEW
80.0000 mg | CHEWABLE_TABLET | Freq: Three times a day (TID) | ORAL | Status: DC
  Administered 2014-06-27 – 2014-06-29 (×5): 80 mg via ORAL
  Filled 2014-06-26 (×6): qty 1

## 2014-06-26 SURGICAL SUPPLY — 39 items
CANISTER WOUND CARE 500ML ATS (WOUND CARE) IMPLANT
CLAMP CORD UMBIL (MISCELLANEOUS) IMPLANT
CLOTH BEACON ORANGE TIMEOUT ST (SAFETY) ×3 IMPLANT
CONTAINER PREFILL 10% NBF 15ML (MISCELLANEOUS) ×6 IMPLANT
DRAPE SHEET LG 3/4 BI-LAMINATE (DRAPES) IMPLANT
DRSG OPSITE POSTOP 4X10 (GAUZE/BANDAGES/DRESSINGS) ×3 IMPLANT
DRSG VAC ATS LRG SENSATRAC (GAUZE/BANDAGES/DRESSINGS) IMPLANT
DRSG VAC ATS MED SENSATRAC (GAUZE/BANDAGES/DRESSINGS) IMPLANT
DRSG VAC ATS SM SENSATRAC (GAUZE/BANDAGES/DRESSINGS) IMPLANT
DURAPREP 26ML APPLICATOR (WOUND CARE) ×3 IMPLANT
ELECT REM PT RETURN 9FT ADLT (ELECTROSURGICAL) ×3
ELECTRODE REM PT RTRN 9FT ADLT (ELECTROSURGICAL) ×1 IMPLANT
EXTRACTOR VACUUM M CUP 4 TUBE (SUCTIONS) IMPLANT
EXTRACTOR VACUUM M CUP 4' TUBE (SUCTIONS)
GLOVE BIO SURGEON STRL SZ8 (GLOVE) ×6 IMPLANT
GOWN STRL REUS W/TWL LRG LVL3 (GOWN DISPOSABLE) ×6 IMPLANT
KIT ABG SYR 3ML LUER SLIP (SYRINGE) IMPLANT
LIQUID BAND (GAUZE/BANDAGES/DRESSINGS) ×3 IMPLANT
NEEDLE HYPO 25X5/8 SAFETYGLIDE (NEEDLE) ×3 IMPLANT
NS IRRIG 1000ML POUR BTL (IV SOLUTION) ×3 IMPLANT
PACK C SECTION WH (CUSTOM PROCEDURE TRAY) ×3 IMPLANT
PAD OB MATERNITY 4.3X12.25 (PERSONAL CARE ITEMS) ×3 IMPLANT
RTRCTR C-SECT PINK 25CM LRG (MISCELLANEOUS) ×3 IMPLANT
STAPLER VISISTAT 35W (STAPLE) IMPLANT
SUT GUT PLAIN 0 CT-3 TAN 27 (SUTURE) IMPLANT
SUT MNCRL 0 VIOLET CTX 36 (SUTURE) ×3 IMPLANT
SUT MNCRL AB 4-0 PS2 18 (SUTURE) IMPLANT
SUT MON AB 2-0 CT1 27 (SUTURE) ×3 IMPLANT
SUT MON AB 3-0 SH 27 (SUTURE)
SUT MON AB 3-0 SH27 (SUTURE) IMPLANT
SUT MONOCRYL 0 CTX 36 (SUTURE) ×6
SUT PDS AB 0 CTX 60 (SUTURE) IMPLANT
SUT PLAIN 2 0 XLH (SUTURE) IMPLANT
SUT VIC AB 0 CTX 36 (SUTURE)
SUT VIC AB 0 CTX36XBRD ANBCTRL (SUTURE) IMPLANT
SUT VIC AB 2-0 CT1 27 (SUTURE)
SUT VIC AB 2-0 CT1 TAPERPNT 27 (SUTURE) IMPLANT
TOWEL OR 17X24 6PK STRL BLUE (TOWEL DISPOSABLE) ×3 IMPLANT
TRAY FOLEY CATH 14FR (SET/KITS/TRAYS/PACK) ×3 IMPLANT

## 2014-06-26 NOTE — Progress Notes (Signed)
Lynn Gallegos is a 29 y.o. G1P0 at 7138w4d by LMP admitted for induction of labor due to Hypertension.  Subjective:   Objective: BP 135/71 mmHg  Pulse 101  Temp(Src) 99.6 F (37.6 C) (Oral)  Resp 16  Ht 5\' 9"  (1.753 m)  Wt 262 lb (118.842 kg)  BMI 38.67 kg/m2  SpO2 97%  LMP 09/15/2013 I/O last 3 completed shifts: In: -  Out: 1200 [Urine:1200]    FHT:  FHR: 140 bpm, variability: minimal ,  accelerations:  Abscent,  decelerations:  Absent UC:   regular, every 4 minutes SVE:   Dilation: 3 Effacement (%): 70 Station: -2 Exam by:: Campbell SoupS Earl RNC  Labs: Lab Results  Component Value Date   WBC 11.9* 06/25/2014   HGB 11.4* 06/25/2014   HCT 34.1* 06/25/2014   MCV 83.6 06/25/2014   PLT 167 06/25/2014    Assessment / Plan: Induction of labor due to gestational hypertension,  progressing well on pitocin  Labor: Latent Preeclampsia:  labs stable Fetal Wellbeing:  Category I Pain Control:  Epidural I/D:  n/a Anticipated MOD:  NSVD  Searra Carnathan A 06/26/2014, 10:25 AM

## 2014-06-26 NOTE — Transfer of Care (Signed)
Immediate Anesthesia Transfer of Care Note  Patient: Lynn Gallegos  Procedure(s) Performed: Procedure(s): CESAREAN SECTION (N/A)  Patient Location: PACU  Anesthesia Type:Epidural  Level of Consciousness: awake, alert  and oriented  Airway & Oxygen Therapy: Patient Spontanous Breathing  Post-op Assessment: Report given to RN and Post -op Vital signs reviewed and stable  Post vital signs: Reviewed and stable  Last Vitals:  Filed Vitals:   06/26/14 2001  BP: 135/97  Pulse: 96  Temp:   Resp:     Complications: No apparent anesthesia complications

## 2014-06-26 NOTE — Op Note (Signed)
Cesarean Section Procedure Note   Lynn Gallegos   06/23/2014 - 06/26/2014  Indications: Arrest of Dilatation   Pre-operative Diagnosis: Arrest of dilation .   Post-operative Diagnosis: Same   Surgeon: HARPER,CHARLES A  Assistants: Surgical Technician  Anesthesia: epidural  Procedure Details:  The patient was seen in the Holding Room. The risks, benefits, complications, treatment options, and expected outcomes were discussed with the patient. The patient concurred with the proposed plan, giving informed consent. The patient was identified as Lynn Gallegos and the procedure verified as C-Section Delivery. A Time Out was held and the above information confirmed.  After induction of anesthesia, the patient was draped and prepped in the usual sterile manner. A transverse incision was made and carried down through the subcutaneous tissue to the fascia. The fascial incision was made and extended transversely. The fascia was separated from the underlying rectus tissue superiorly and inferiorly. The peritoneum was identified and entered. The peritoneal incision was extended longitudinally. The utero-vesical peritoneal reflection was incised transversely and the bladder flap was bluntly freed from the lower uterine segment. A low transverse uterine incision was made. Delivered from cephalic presentation was a 3140 gram living newborn female infant(s). APGAR (1 MIN):    Lynn Gallegos, [name] [[id number]][[id number]]    Lynn Gallegos, Lynn Gallegos [[id number]][[id number]]  6   APGAR (5 MINS):    Lynn Gallegos, [name] [[id number]][[id number]]    Lynn Gallegos, Lynn Gallegos [[id number]][[id number]]  8   APGAR (10 MINS):    Lynn Gallegos, [name] [[id number]][[id number]]    Lynn Gallegos, Lynn Gallegos [[id number]][[id number]]     A cord ph was not sent. The umbilical cord was clamped and cut cord. A sample was obtained for evaluation. The placenta was removed Intact and appeared normal.  The uterine incision was closed with running locked sutures of 0 Monocryl. A second imbricating layer of the same  suture was placed.  Hemostasis was observed. The paracolic gutters were irrigated. The parieto peritoneum was closed in a running fashion with 2-0 Vicryl.  The fascia was then reapproximated with running sutures of 0 Vicryl.  The skin was closed with staples.  Instrument, sponge, and needle counts were correct prior the abdominal closure and were correct at the conclusion of the case.    Findings:   Estimated Blood Loss: * No blood loss amount entered *   Total IV Fluids: 1500ml   Urine Output: 100CC OF clear urine  Specimens: @ORSPECIMEN @   Complications: no complications  Disposition: PACU - hemodynamically stable.  Maternal Condition: stable   Baby condition / location:  Couplet care / Skin to Skin    Signed: Surgeon(s): Brock Badharles A Harper, MD

## 2014-06-26 NOTE — Progress Notes (Signed)
POC for C/S initiated.  Pt and FOB not happy with the course of this induction.  Requesting a new MD.  Dr Clearance CootsHarper and Dr Lynetta MareAnyawu at bedside to discuss options.  Answered questions for family.  POC is to continue with C/S with Dr Clearance CootsHarper performing the case.  Consent obtained.  Pt and family requesting this RN to hold off preparing her for the C/S until she is "mentally ready".  FHT's reassuring, not reactive.

## 2014-06-26 NOTE — Progress Notes (Signed)
Lynn Gallegos is a 29 y.o. G1P0 at 7140w4d by LMP admitted for induction of labor due to Hypertension.  Subjective:   Objective: BP 162/93 mmHg  Pulse 90  Temp(Src) 98.6 F (37 C) (Oral)  Resp 18  Ht 5\' 9"  (1.753 m)  Wt 262 lb (118.842 kg)  BMI 38.67 kg/m2  SpO2 97%  LMP 09/15/2013 I/O last 3 completed shifts: In: -  Out: 1200 [Urine:1200] Total I/O In: -  Out: 1200 [Urine:1200]  FHT:  FHR: 140 bpm, variability: moderate,  accelerations:  Present,  decelerations:  Absent UC:   regular, every 3-4 minutes SVE:   Dilation: 3.5 Effacement (%): 100 Station: -1, 0 Exam by:: Campbell [employer] Earl RNC  Labs: Lab Results  Component Value Date   WBC 11.9* 06/25/2014   HGB 11.4* 06/25/2014   HCT 34.1* 06/25/2014   MCV 83.6 06/25/2014   PLT 167 06/25/2014    Assessment / Plan:  40.4 weeks.  Gestational Hypertension.  Failed IOL with arrest of dilatation.  Will proceed with C/S for arrest of dilatation.   Labor: No progress for > 4 hours with adequate contractility. Preeclampsia:  BP and labs stable Fetal Wellbeing:  Category I Pain Control:  Epidural I/D:  n/a Anticipated MOD:  Cesarean Section  HARPER,CHARLES A 06/26/2014, 6:00 PM

## 2014-06-26 NOTE — Progress Notes (Signed)
Patient and FOB agreeable to proceed with c-section.

## 2014-06-26 NOTE — Anesthesia Postprocedure Evaluation (Signed)
Anesthesia Post Note  Patient: Lynn Gallegos  Procedure(s) Performed: Procedure(s) (LRB): CESAREAN SECTION (N/A)  Anesthesia type: Epidural  Patient location: PACU  Post pain: Pain level controlled  Post assessment: Post-op Vital signs reviewed  Last Vitals:  Filed Vitals:   06/26/14 2145  BP: 119/60  Pulse: 93  Temp:   Resp: 22    Post vital signs: Reviewed  Level of consciousness: awake  Complications: No apparent anesthesia complications

## 2014-06-27 LAB — CBC
HCT: 29.6 % — ABNORMAL LOW (ref 36.0–46.0)
Hemoglobin: 10 g/dL — ABNORMAL LOW (ref 12.0–15.0)
MCH: 28.1 pg (ref 26.0–34.0)
MCHC: 33.8 g/dL (ref 30.0–36.0)
MCV: 83.1 fL (ref 78.0–100.0)
PLATELETS: 144 10*3/uL — AB (ref 150–400)
RBC: 3.56 MIL/uL — AB (ref 3.87–5.11)
RDW: 13.2 % (ref 11.5–15.5)
WBC: 18 10*3/uL — ABNORMAL HIGH (ref 4.0–10.5)

## 2014-06-27 NOTE — Lactation Note (Signed)
This note was copied from the chart of Lynn Lorin Glassoni Politte. Lactation Consultation Note: Follow up visit with mom- she has baby latched to the breast when I went in. Reports that baby has been nursing well with no pain. Reports she is able to hand express Colostrum and is compressing her breasts while nursing. No questions at present. To call for assist prn  Patient Name: Lynn Gallegos ZOXWR'UToday's Date: 06/27/2014 Reason for consult: Follow-up assessment   Maternal Data Formula Feeding for Exclusion: No Has patient been taught Hand Expression?: Yes  Feeding Feeding Type: Breast Fed  LATCH Score/Interventions Latch: Grasps breast easily, tongue down, lips flanged, rhythmical sucking.  Audible Swallowing: A few with stimulation  Type of Nipple: Everted at rest and after stimulation  Comfort (Breast/Nipple): Soft / non-tender     Hold (Positioning): No assistance needed to correctly position infant at breast.  LATCH Score: 9  Lactation Tools Discussed/Used     Consult Status Consult Status: Follow-up Date: 06/28/14 Follow-up type: In-patient    Pamelia HoitWeeks, Klaus Casteneda D 06/27/2014, 3:17 PM

## 2014-06-27 NOTE — Anesthesia Postprocedure Evaluation (Signed)
  Anesthesia Post-op Note  Patient: Lynn Gallegos  Procedure(s) Performed: Procedure(s): CESAREAN SECTION (N/A)  Patient Location: Mother/Baby  Anesthesia Type:Epidural  Level of Consciousness: awake and alert   Airway and Oxygen Therapy: Patient Spontanous Breathing  Post-op Pain: mild  Post-op Assessment: Post-op Vital signs reviewed, Patient's Cardiovascular Status Stable, Respiratory Function Stable, No signs of Nausea or vomiting, Pain level controlled, No headache, No residual numbness and No residual motor weakness  Post-op Vital Signs: Reviewed  Last Vitals:  Filed Vitals:   06/27/14 0548  BP: 128/59  Pulse: 70  Temp: 36.8 C  Resp: 16    Complications: No apparent anesthesia complications

## 2014-06-27 NOTE — Progress Notes (Signed)
Subjective: Postpartum Day 1: Cesarean Delivery Patient reports tolerating PO, + flatus and no problems voiding.    Objective: Vital signs in last 24 hours: Temp:  [98.2 F (36.8 C)-99.6 F (37.6 C)] 98.3 F (36.8 C) (02/12 0548) Pulse Rate:  [70-104] 70 (02/12 0548) Resp:  [15-23] 16 (02/12 0548) BP: (106-173)/(46-121) 128/59 mmHg (02/12 0548) SpO2:  [94 %-98 %] 97 % (02/12 0548)  Physical Exam:  General: alert and no distress Lochia: appropriate Uterine Fundus: firm Incision: healing well DVT Evaluation: No evidence of DVT seen on physical exam.   Recent Labs  06/26/14 2155 06/27/14 0550  HGB 10.4* 10.0*  HCT 31.2* 29.6*    Assessment/Plan: Status post Cesarean section. Doing well postoperatively.  Continue current care.  Aleicia Kenagy A 06/27/2014, 9:06 AM

## 2014-06-27 NOTE — Lactation Note (Signed)
This note was copied from the chart of Lynn Lorin Glassoni Edelson. Lactation Consultation Note New mom hasn't BF well only a couple a times off and on. Attempted in football position, baby latched and only sucked a few times w/t-cup hold. Baby tongue thrusting nipple out. Mom has large breast w/everted nipples, short shaft. Areolas very compressible and able to obtain latch if baby didn't tongue thrust it out. Shells given to wear in bra today. Mom shown how to use DEBP & how to disassemble, clean, & reassemble parts. Mom encouraged to pump if baby doesn't BF every three hours, and post-pump to stimulate milk and pull nipple out.  Baby has high palate, tongue curls up and has uncordinated suck. Hand expression demonstrated w/approx. 0.815ml expressed in spoon to baby by suck training w/gloved finger. Encouraged lots of STS. Mom encouraged to feed baby 8-12 times/24 hours and with feeding cues. Mom encouraged to waken baby for feeds. Mom reports + breast changes w/pregnancy. Educated about newborn behavior. Referred to Baby and Me Book in Breastfeeding section Pg. 22-23 for position options and Proper latch demonstration.[facility] brochure given w/resources, support groups and LC services. Patient Name: Lynn Gallegos ZOXWR'UToday's Date: 06/27/2014 Reason for consult: Initial assessment   Maternal Data    Feeding Feeding Type: Breast Fed Length of feed: 0 min  LATCH Score/Interventions Latch: Too sleepy or reluctant, no latch achieved, no sucking elicited. Intervention(s): Skin to skin;Teach feeding cues;Waking techniques Intervention(s): Adjust position;Assist with latch;Breast massage;Breast compression  Audible Swallowing: None Intervention(s): Skin to skin;Hand expression  Type of Nipple: Everted at rest and after stimulation  Comfort (Breast/Nipple): Soft / non-tender     Hold (Positioning): Assistance needed to correctly position infant at breast and maintain latch. Intervention(s): Breastfeeding  basics reviewed;Support Pillows;Position options;Skin to skin  LATCH Score: 5  Lactation Tools Discussed/Used Tools: Shells;Pump Shell Type: Inverted Breast pump type: Double-Electric Breast Pump Pump Review: Setup, frequency, and cleaning;Milk Storage Initiated by:: Peri JeffersonL. Evadean Sproule RN Date initiated:: 06/27/14   Consult Status Consult Status: Follow-up Date: 06/27/14 Follow-up type: In-patient    Charyl DancerCARVER, Cedrica Brune G 06/27/2014, 6:36 AM

## 2014-06-27 NOTE — Addendum Note (Signed)
Addendum  created 06/27/14 0758 by Angela Adamana G Lenix Kidd, CRNA   Modules edited: Notes Section   Notes Section:  File: [id number]

## 2014-06-28 ENCOUNTER — Encounter (HOSPITAL_COMMUNITY): Payer: Self-pay | Admitting: Obstetrics

## 2014-06-28 NOTE — Progress Notes (Signed)
Subjective: Postpartum Day 2: Cesarean Delivery Patient reports tolerating PO, + flatus and no problems voiding.    Objective: Vital signs in last 24 hours: Temp:  [97.6 F (36.4 C)-98 F (36.7 C)] 97.8 F (36.6 C) (02/13 0500) Pulse Rate:  [65-76] 70 (02/13 0500) Resp:  [16-18] 18 (02/13 0500) BP: (120-123)/(55-76) 123/76 mmHg (02/13 0500) SpO2:  [97 %-98 %] 98 % (02/12 2115)  Physical Exam:  General: alert and no distress Lochia: appropriate Uterine Fundus: firm Incision: healing well DVT Evaluation: No evidence of DVT seen on physical exam.   Recent Labs  06/26/14 2155 06/27/14 0550  HGB 10.4* 10.0*  HCT 31.2* 29.6*    Assessment/Plan: Status post Cesarean section. Doing well postoperatively.  Continue current care.  HARPER,CHARLES A 06/28/2014, 7:40 AM

## 2014-06-28 NOTE — Lactation Note (Signed)
This note was copied from the chart of [name]. Lactation Consultation Note  Follow up visit made.  Mom states baby was cluster feeding last night but more satisfied after feeding today.  Encouraged to feed with cues and call for assist/concerns prn.  Patient Name: [name] UJWJX'BToday's Date: 06/28/2014     Maternal Data    Feeding Feeding Type: Breast Fed Length of feed: 15 min  LATCH Score/Interventions Latch: Repeated attempts needed to sustain latch, nipple held in mouth throughout feeding, stimulation needed to elicit sucking reflex. Intervention(s): Teach feeding cues;Waking techniques Intervention(s): Adjust position;Assist with latch;Breast compression  Audible Swallowing: A few with stimulation  Type of Nipple: Everted at rest and after stimulation  Comfort (Breast/Nipple): Soft / non-tender     Hold (Positioning): Assistance needed to correctly position infant at breast and maintain latch.  LATCH Score: 7  Lactation Tools Discussed/Used     Consult Status      Huston FoleyMOULDEN, Wakisha Alberts S 06/28/2014, 11:05 AM

## 2014-06-29 MED ORDER — OXYCODONE-ACETAMINOPHEN 5-325 MG PO TABS: 1.0000 | ORAL_TABLET | ORAL | Status: DC | PRN

## 2014-06-29 MED ORDER — LABETALOL HCL 200 MG PO TABS: 200.0000 mg | ORAL_TABLET | Freq: Three times a day (TID) | ORAL | Status: DC

## 2014-06-29 MED ORDER — IBUPROFEN 600 MG PO TABS: 600.0000 mg | ORAL_TABLET | Freq: Four times a day (QID) | ORAL | Status: DC | PRN

## 2014-06-29 NOTE — Lactation Note (Signed)
This note was copied from the chart of Girl Lorin Glassoni Wahlen. Lactation Consultation Note: follow up visit with mom before DC. Mom reports that baby has been latching well and they have given some formula also. Baby last took 20 cc's of formula and is asleep on mom's chest  Reports breasts are feeling a little fuller this morning. Reviewed importance of frequent feedings to prevent engorgement. No questions at present. Reviewed [facility]FS[facility] and OP appointments as resources for support after DC. To call prn  Patient Name: Girl Lorin Glassoni Mangrum ZHYQM'VToday's Date: 06/29/2014 Reason for consult: Initial assessment   Maternal Data Formula Feeding for Exclusion: No Has patient been taught Hand Expression?: Yes Does the patient have breastfeeding experience prior to this delivery?: No  Feeding   LATCH Score/Interventions                      Lactation Tools Discussed/Used     Consult Status Consult Status: Complete    Pamelia HoitWeeks, Dalores Weger D 06/29/2014, 10:53 AM

## 2014-06-29 NOTE — Discharge Summary (Signed)
Obstetric Discharge Summary Reason for Admission: induction of labor Prenatal Procedures: NST and ultrasound Intrapartum Procedures: cesarean: low cervical, transverse Postpartum Procedures: none Complications-Operative and Postpartum: none HEMOGLOBIN  Date Value Ref Range Status  06/27/2014 10.0* 12.0 - 15.0 g/dL Final   HCT  Date Value Ref Range Status  06/27/2014 29.6* 36.0 - 46.0 % Final    Physical Exam:  General: alert and no distress Lochia: appropriate Uterine Fundus: firm Incision: healing well DVT Evaluation: No evidence of DVT seen on physical exam.  Discharge Diagnoses: Term Pregnancy-delivered  Discharge Information: Date: 06/29/2014 Activity: pelvic rest Diet: routine Medications: PNV, Ibuprofen, Percocet, Colace Condition: stable Instructions: refer to practice specific booklet Discharge to: home Follow-up Information    Follow up with HARPER,CHARLES A, MD. Schedule an appointment as soon as possible for a visit in 2 weeks.   Specialty:  Obstetrics and Gynecology   Contact information:   [address] [place] [address] [phone]       Newborn Data:   Viann FishBreeden, [name] [[id number]][[id number]]  Live born unspecified sex  Birth Weight:   APGAR: ,    Barbette MerinoBreeden, Girl Sheralyn Boatmanoni [[id number]][[id number]]  Live born female  Birth Weight: 6 lb 14.8 oz (3140 g) APGAR: 6, 8  Home with mother.  HARPER,CHARLES A 06/29/2014, 5:51 AM

## 2014-06-29 NOTE — Progress Notes (Signed)
Subjective: Postpartum Day 3: Cesarean Delivery Patient reports tolerating PO, + flatus, + BM and no problems voiding.    Objective: Vital signs in last 24 hours: Temp:  [98.3 F (36.8 C)] 98.3 F (36.8 C) (02/13 1900) Pulse Rate:  [70] 70 (02/13 1900) Resp:  [18] 18 (02/13 1900) BP: (131)/(81) 131/81 mmHg (02/13 1900) SpO2:  [100 %] 100 % (02/13 1900)  Physical Exam:  General: alert and no distress Lochia: appropriate Uterine Fundus: firm Incision: healing well DVT Evaluation: No evidence of DVT seen on physical exam.   Recent Labs  06/26/14 2155 06/27/14 0550  HGB 10.4* 10.0*  HCT 31.2* 29.6*    Assessment/Plan: Status post Cesarean section. Doing well postoperatively.  Discharge home with standard precautions and return to clinic in 2 weeks.  Akiah Bauch A 06/29/2014, 5:45 AM

## 2014-06-30 NOTE — Progress Notes (Signed)
Ur chart review completed.  

## 2014-07-14 ENCOUNTER — Telehealth: Payer: Self-pay

## 2014-07-14 NOTE — Telephone Encounter (Signed)
FMLA papers ready for pick-up 15.00 charge, left message for patient

## 2014-07-17 ENCOUNTER — Ambulatory Visit: Payer: Medicaid Other | Admitting: Obstetrics

## 2014-07-18 ENCOUNTER — Ambulatory Visit: Payer: Medicaid Other | Admitting: Obstetrics

## 2014-08-05 ENCOUNTER — Ambulatory Visit: Payer: Medicaid Other | Admitting: Obstetrics

## 2014-08-25 DIAGNOSIS — Z029 Encounter for administrative examinations, unspecified: Secondary | ICD-10-CM

## 2014-09-24 ENCOUNTER — Ambulatory Visit (INDEPENDENT_AMBULATORY_CARE_PROVIDER_SITE_OTHER): Payer: Medicaid Other | Admitting: Obstetrics

## 2014-09-24 ENCOUNTER — Encounter: Payer: Self-pay | Admitting: Obstetrics

## 2014-09-24 VITALS — BP 139/92 | HR 78 | Temp 97.4°F | Ht 71.0 in | Wt 240.0 lb

## 2014-09-24 DIAGNOSIS — Z3049 Encounter for surveillance of other contraceptives: Secondary | ICD-10-CM | POA: Diagnosis not present

## 2014-09-24 DIAGNOSIS — Z01812 Encounter for preprocedural laboratory examination: Secondary | ICD-10-CM

## 2014-09-24 DIAGNOSIS — Z308 Encounter for other contraceptive management: Secondary | ICD-10-CM

## 2014-09-24 DIAGNOSIS — Z30017 Encounter for initial prescription of implantable subdermal contraceptive: Secondary | ICD-10-CM

## 2014-09-24 LAB — POCT URINE PREGNANCY: Preg Test, Ur: NEGATIVE

## 2014-09-24 NOTE — Progress Notes (Signed)
Subjective:     Lynn Gallegos is a 29 y.o. female who presents for a postpartum visit. She is 3 month postpartum following a low cervical transverse Cesarean section. I have fully reviewed the prenatal and intrapartum course. The delivery was at 40.4 gestational weeks. Outcome: primary cesarean section, low transverse incision. Anesthesia: epidural. Postpartum course has been normal. Baby's course has been normal. Baby is feeding by both breast and bottle - Similac Advance. Bleeding no bleeding. Bowel function is normal. Bladder function is normal. Patient is sexually active. Contraception method is condoms. Postpartum depression screening: negative.  Tobacco, alcohol and substance abuse history reviewed.  Adult immunizations reviewed including TDAP, rubella and varicella.  The following portions of the patient's history were reviewed and updated as appropriate: allergies, current medications, past family history, past medical history, past social history, past surgical history and problem list.  A comprehensive review of systems was negative.   Objective:    BP 139/92 mmHg  Pulse 78  Temp(Src) 97.4 F (36.3 C)  Ht 5\' 11"  (1.803 m)  Wt 240 lb (108.863 kg)  BMI 33.49 kg/m2  LMP 09/19/2014  Breastfeeding? Yes  General:  alert and no distress   Breasts:  inspection negative, no nipple discharge or bleeding, no masses or nodularity palpable  Lungs: clear to auscultation bilaterally  Heart:  regular rate and rhythm, S1, S2 normal, no murmur, click, rub or gallop  Abdomen: normal findings: soft, non-tender   Vulva:  normal  Vagina: normal vagina  Cervix:  no cervical motion tenderness  Corpus: normal size, contour, position, consistency, mobility, non-tender  Adnexa:  no mass, fullness, tenderness  Rectal Exam: Not performed.           Assessment:     Normal postpartum exam. Pap smear not done at today's visit.  Plan:    1. Contraception: Nexplanon 2. Nexplanon inserted today 3.  Follow up in: 2 weeks or as needed.   Healthy lifestyle practices reviewed   Nexplanon Procedure Note   PRE-OP DIAGNOSIS: desired long-term, reversible contraception  POST-OP DIAGNOSIS: Same  PROCEDURE: Nexplanon  placement Performing Provider: Coral Ceoharles Harper MD  Patient education prior to procedure, explained risk, benefits of Nexplanon, reviewed alternative options. Patient reported understanding. Gave consent to continue with procedure.   PROCEDURE:  Pregnancy Text :  Negative Site (check):      left arm         Sterile Preparation:   Betadinex3 Lot # [id number] / [id number] Expiration Date 07 / 2018  Insertion site was selected 8 - 10 cm from medial epicondyle and marked along with guiding site using sterile marker. Procedure area was prepped and draped in a sterile fashion. 1% Lidocaine 1.5 ml given prior to procedure. Nexplanon  was inserted subcutaneously.Needle was removed from the insertion site. Nexplanon capsule was palpated by provider and patient to assure satisfactory placement. Dressing applied.  Followup: The patient tolerated the procedure well without complications.  Standard post-procedure care is explained and return precautions are given.  Coral Ceoharles Harper MD

## 2014-09-27 LAB — SURESWAB, VAGINOSIS/VAGINITIS PLUS
Atopobium vaginae: NOT DETECTED Log (cells/mL)
BV CATEGORY: UNDETERMINED — AB
C. albicans, DNA: NOT DETECTED
C. glabrata, DNA: NOT DETECTED
C. parapsilosis, DNA: DETECTED — AB
C. trachomatis RNA, TMA: NOT DETECTED
C. tropicalis, DNA: NOT DETECTED
Gardnerella vaginalis: 7.8 Log (cells/mL)
LACTOBACILLUS SPECIES: 7.2 Log (cells/mL)
MEGASPHAERA SPECIES: NOT DETECTED Log (cells/mL)
N. GONORRHOEAE RNA, TMA: NOT DETECTED
T. VAGINALIS RNA, QL TMA: NOT DETECTED

## 2014-10-02 ENCOUNTER — Other Ambulatory Visit: Payer: Self-pay | Admitting: Obstetrics

## 2014-10-02 DIAGNOSIS — B373 Candidiasis of vulva and vagina: Secondary | ICD-10-CM

## 2014-10-02 DIAGNOSIS — B3731 Acute candidiasis of vulva and vagina: Secondary | ICD-10-CM

## 2014-10-02 DIAGNOSIS — N760 Acute vaginitis: Principal | ICD-10-CM

## 2014-10-02 DIAGNOSIS — B9689 Other specified bacterial agents as the cause of diseases classified elsewhere: Secondary | ICD-10-CM

## 2014-10-02 MED ORDER — FLUCONAZOLE 150 MG PO TABS: 150.0000 mg | ORAL_TABLET | Freq: Once | ORAL | Status: DC

## 2014-10-02 MED ORDER — TINIDAZOLE 500 MG PO TABS: 1000.0000 mg | ORAL_TABLET | Freq: Every day | ORAL | Status: DC

## 2014-10-08 ENCOUNTER — Ambulatory Visit: Payer: Medicaid Other | Admitting: Obstetrics

## 2016-07-15 IMAGING — US US OB COMP +14 WK
1 series · 12 of 28 positions shown · non-contrast
Comparison: none

[Series 1: us ob comp +14 wk mfm · 12 of 82 slices shown]
[im 4/82]
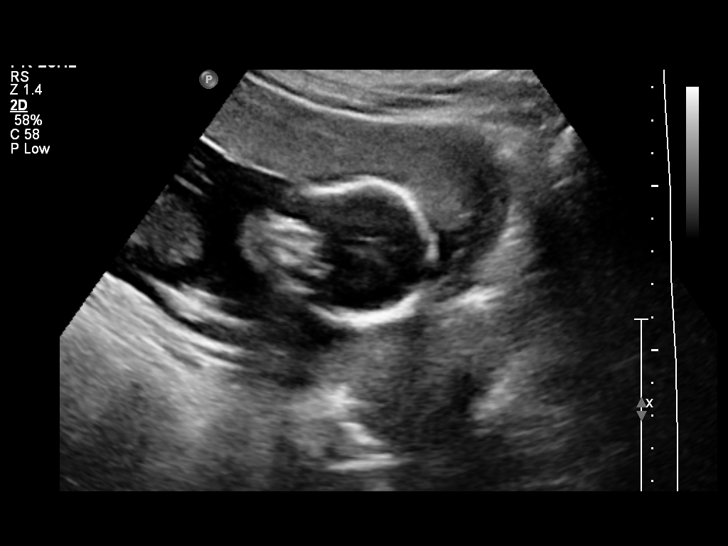
[im 10/82]
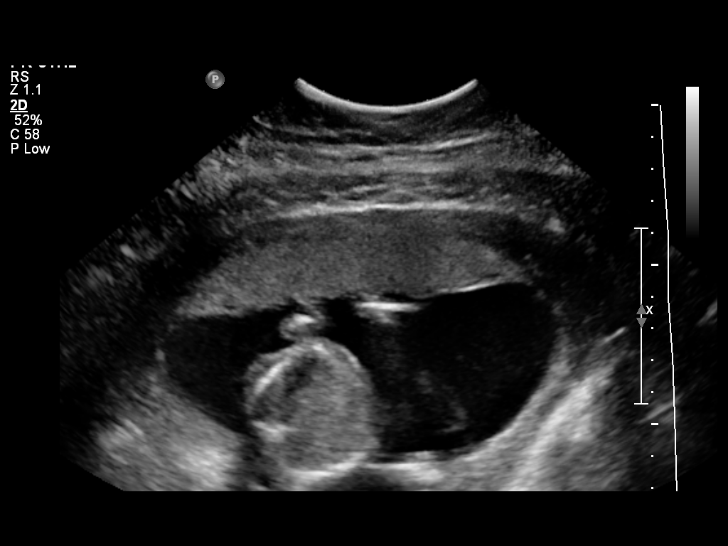
[im 16/82]
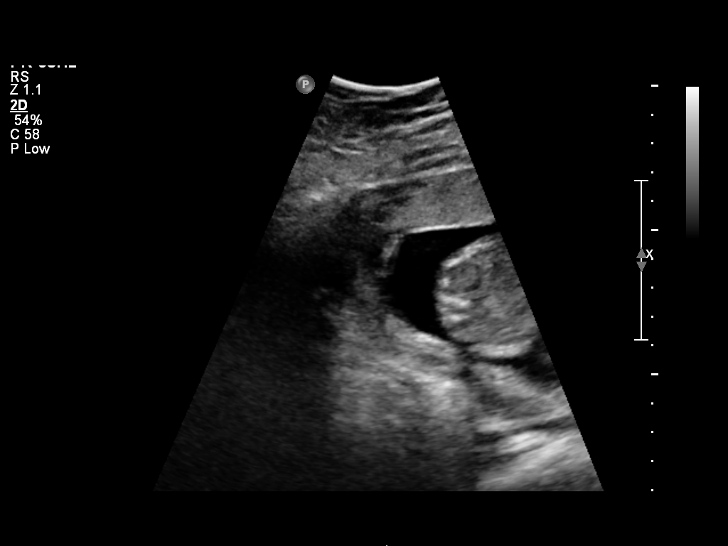
[im 25/82]
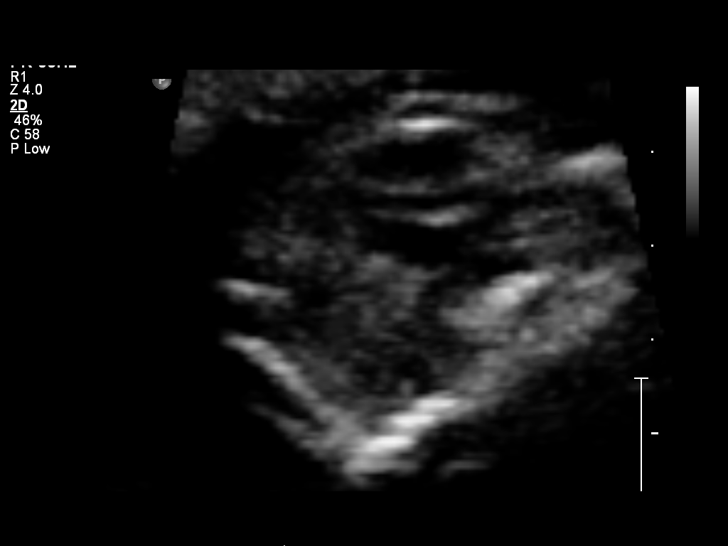
[im 31/82]
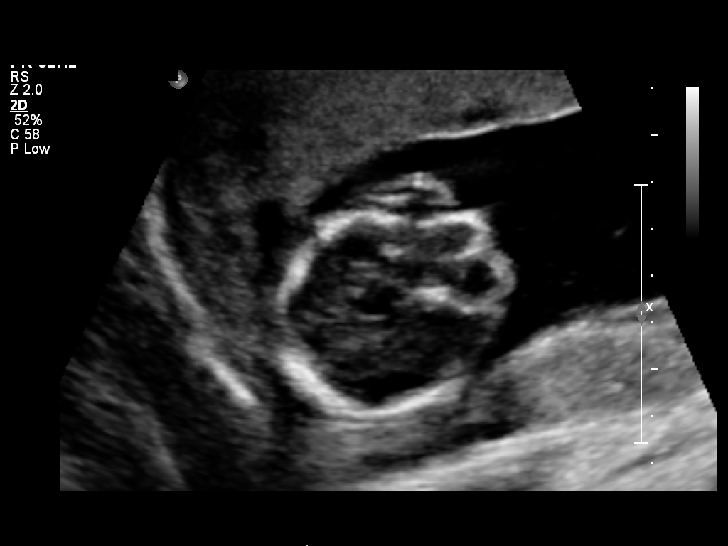
[im 37/82]
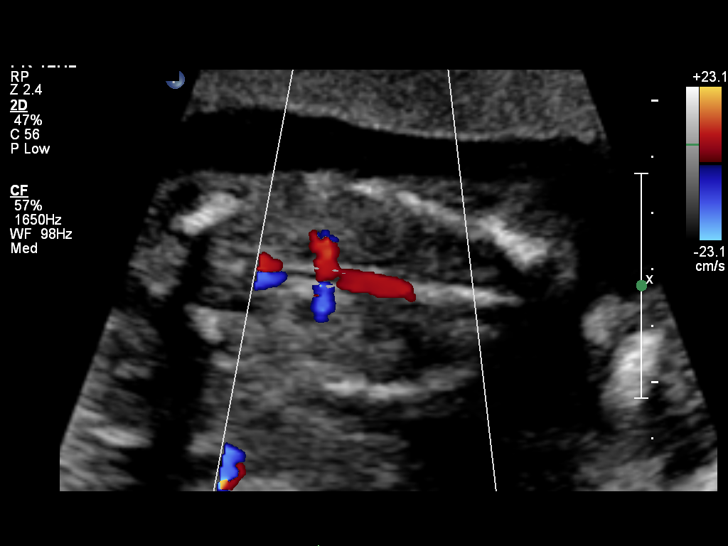
[im 46/82]
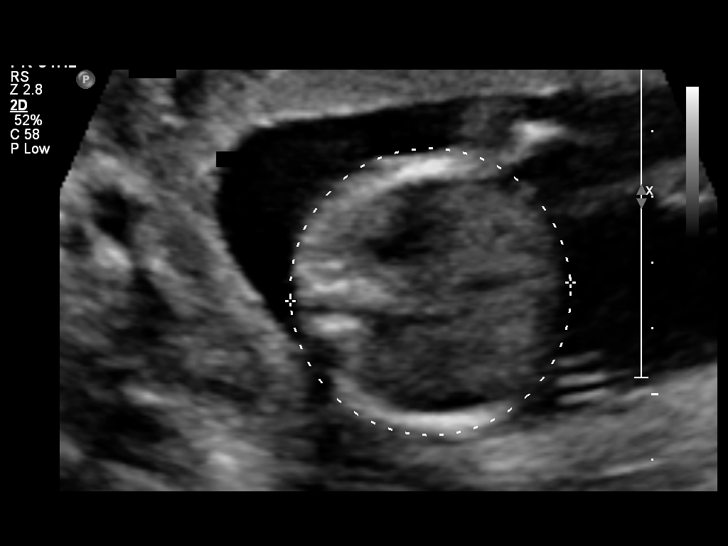
[im 52/82]
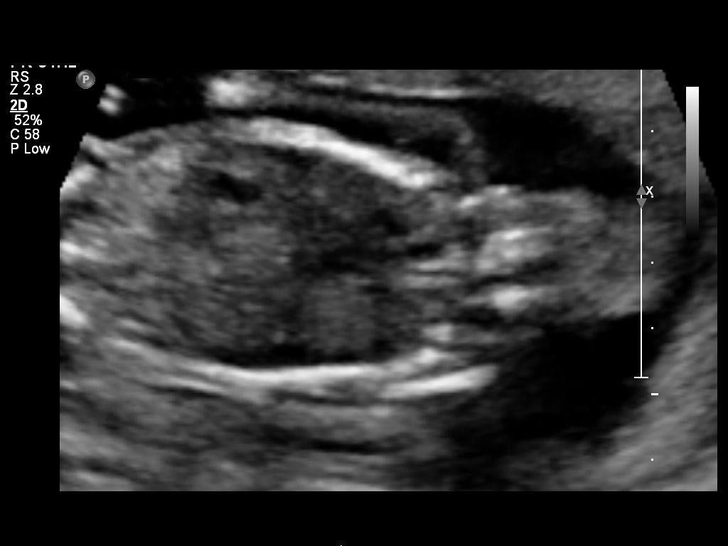
[im 58/82]
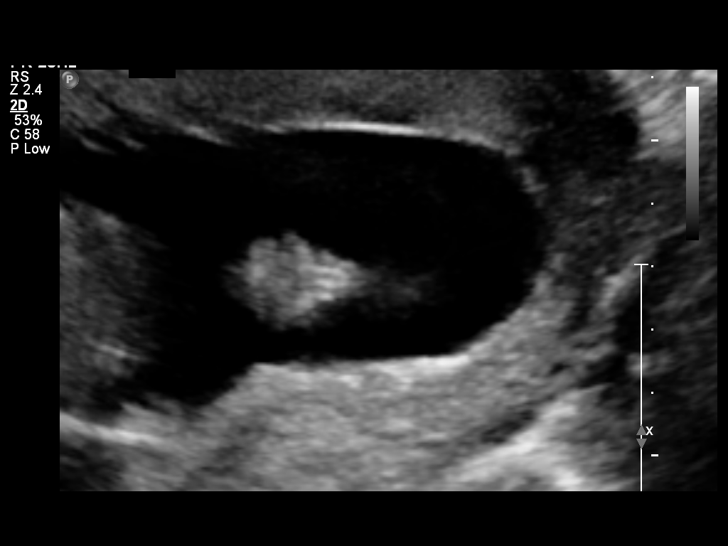
[im 67/82]
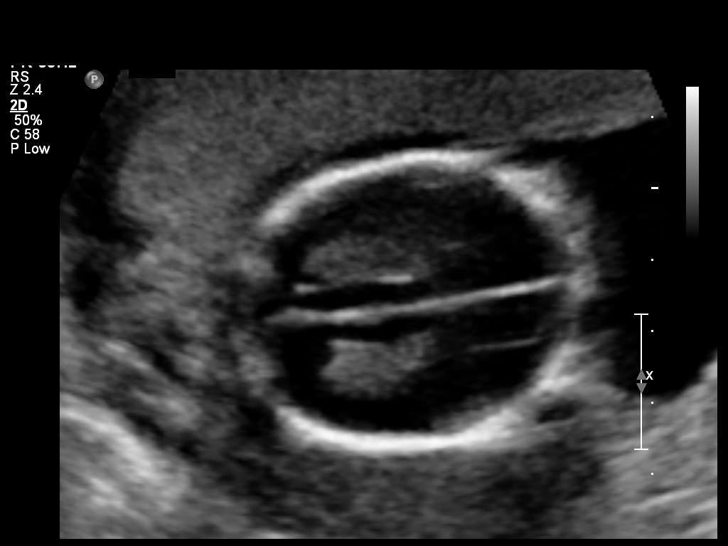
[im 73/82]
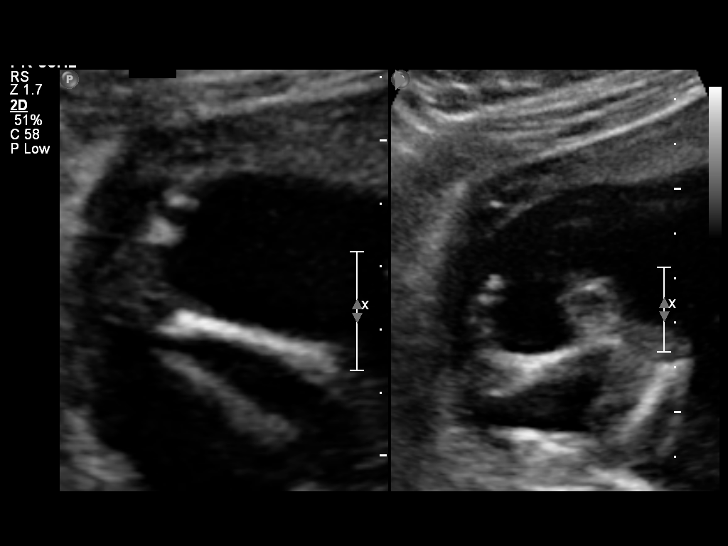
[im 79/82]
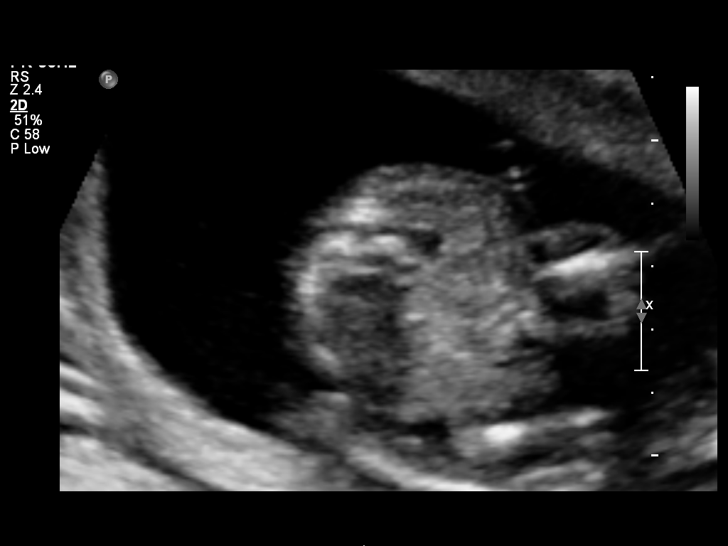

[12 of 28 positions shown; findings below may reference images not displayed]

OBSTETRICS REPORT
                      (Signed Final 01/28/2014 [DATE])

Service(s) Provided

 US OB COMP + 14 WK                                    76805.1
Indications

 Basic anatomic survey
Fetal Evaluation

 Num Of Fetuses:    1
 Cardiac Activity:  Observed
 Presentation:      Cephalic
 Placenta:          Anterior, above cervical os
 P. Cord            Visualized, central
 Insertion:

 Amniotic Fluid
 AFI FV:      Subjectively within normal limits
                                             Larg Pckt:    4.15  cm
Gestational Age

 LMP:           19w 2d        Date:  09/15/13                 EDD:   06/22/14
 Best:          19w 2d     Det. By:  LMP  (09/15/13)          EDD:   06/22/14
Anatomy

 Cranium:          Appears normal         Aortic Arch:      Appears normal
 Fetal Cavum:      Appears normal         Ductal Arch:      Appears normal
 Ventricles:       Appears normal         Diaphragm:        Appears normal
 Choroid Plexus:   Appears normal         Stomach:          Appears normal, left
                                                            sided
 Cerebellum:       Appears normal         Abdomen:          Appears normal
 Posterior Fossa:  Appears normal         Abdominal Wall:   Appears nml (cord
                                                            insert, abd wall)
 Nuchal Fold:      Appears normal         Cord Vessels:     Appears normal (3
                                                            vessel cord)
 Face:             Appears normal         Kidneys:          Appear normal
                   (orbits and profile)
 Lips:             Appears normal         Bladder:          Appears normal
 Heart:            Echogenic focus        Spine:            Appears normal
                   in LV
 RVOT:             Appears normal         Lower             Appears normal
                                          Extremities:
 LVOT:             Appears normal         Upper             Appears normal
                                          Extremities:
 Other:  Fetus appears to be a female. 5th digit visualized. Nasal bone
         visualized. Technically difficult due to fetal position.
Cervix Uterus Adnexa

 Cervical Length:    3.2      cm

 Cervix:       Normal appearance by transabdominal scan.
 Uterus:       No abnormality visualized.
 Cul De Sac:   No free fluid seen.

 Left Ovary:    Within normal limits.
 Right Ovary:   Not visualized.
 Adnexa:     No abnormality visualized.
Comments

 An echogenic focus was seen in the left cardiac ventricle.
 This is felt to represent a calcified papillary muscle, and is not
 associated with structural or functional cardiac abnormalities.
 Although an echogenic cardiac focus may be associated with
 an increased risk of Down syndrome, this risk is felt to be
 minimal, especially when it is seen as an isolated finding.
Impression

 Single IUP at 19w 2d
 An echogenic intracardiac focus was noted in the left
 ventricle (see comments)
 The fetal anatomic survey was otherwise within normal limits
 No other markers associated with aneuploidy were noted
 Anterior placenta without previa
 Normal amniotic fluid volume
Recommendations

 Follow-up ultrasounds as clinically indicated.

 questions or concerns.

## 2020-04-27 ENCOUNTER — Emergency Department (HOSPITAL_BASED_OUTPATIENT_CLINIC_OR_DEPARTMENT_OTHER)
Admission: EM | Admit: 2020-04-27 | Discharge: 2020-04-27 | Disposition: A | Discharge status: Home / Self Care | Payer: Medicaid Other | Attending: Emergency Medicine | Admitting: Emergency Medicine

## 2020-04-27 ENCOUNTER — Encounter (HOSPITAL_BASED_OUTPATIENT_CLINIC_OR_DEPARTMENT_OTHER): Payer: Self-pay | Admitting: *Deleted

## 2020-04-27 ENCOUNTER — Other Ambulatory Visit: Payer: Self-pay

## 2020-04-27 DIAGNOSIS — Z79899 Other long term (current) drug therapy: Secondary | ICD-10-CM | POA: Insufficient documentation

## 2020-04-27 DIAGNOSIS — Z20822 Contact with and (suspected) exposure to covid-19: Secondary | ICD-10-CM | POA: Diagnosis not present

## 2020-04-27 DIAGNOSIS — J101 Influenza due to other identified influenza virus with other respiratory manifestations: Secondary | ICD-10-CM | POA: Diagnosis not present

## 2020-04-27 DIAGNOSIS — I10 Essential (primary) hypertension: Secondary | ICD-10-CM | POA: Diagnosis not present

## 2020-04-27 DIAGNOSIS — R509 Fever, unspecified: Secondary | ICD-10-CM | POA: Diagnosis present

## 2020-04-27 LAB — RESP PANEL BY RT-PCR (FLU A&B, COVID) ARPGX2
Influenza A by PCR: POSITIVE — AB
Influenza B by PCR: NEGATIVE
SARS Coronavirus 2 by RT PCR: NEGATIVE

## 2020-04-27 NOTE — ED Triage Notes (Signed)
Fever, chills, sore throat and cough x 3 days.

## 2020-04-27 NOTE — Discharge Instructions (Addendum)
Return if any problems.

## 2020-04-27 NOTE — ED Provider Notes (Signed)
[facility] EMERGENCY DEPARTMENT Provider Note   CSN: [id number] Arrival date & time: 04/27/20  1459     History Chief Complaint  Patient presents with  . Fever    Lynn Gallegos is a 34 y.o. female.  The history is provided by the patient. No language interpreter was used.  Fever Temp source:  Subjective Severity:  Moderate Onset quality:  Gradual Duration:  4 days Timing:  Constant Progression:  Worsening Chronicity:  New Relieved by:  Nothing Worsened by:  Nothing Ineffective treatments:  None tried Associated symptoms: cough   Risk factors: sick contacts   Pt reports her daughter became sick and then she became sick.     Past Medical History:  Diagnosis Date  . Hypertension     Patient Active Problem List   Diagnosis Date Noted  . S/P cesarean section 06/26/2014  . Indication for care in labor or delivery 06/23/2014  . Determine fetal presentation using ultrasound   . [redacted] weeks gestation of pregnancy   . PIH (pregnancy induced hypertension) 06/18/2014    Past Surgical History:  Procedure Laterality Date  . CESAREAN SECTION N/A 06/26/2014   Procedure: CESAREAN SECTION;  Surgeon: Brock Bad, MD;  Location: [facility];  Service: Obstetrics;  Laterality: N/A;  . NO PAST SURGERIES       OB History    Gravida  1   Para  1   Term  1   Preterm      AB      Living  1     SAB      IAB      Ectopic      Multiple  0   Live Births  1           Family History  Problem Relation Age of Onset  . Aneurysm Mother     Social History   Tobacco Use  . Smoking status: Never Smoker  . Smokeless tobacco: Never Used  Substance Use Topics  . Alcohol use: No    Alcohol/week: 0.0 standard drinks  . Drug use: No    Home Medications Prior to Admission medications   Medication Sig Start Date End Date Taking? Authorizing Provider  AMLODIPINE-ATORVASTATIN PO Take by mouth.    [provider]  fluconazole (DIFLUCAN) 150 MG  tablet Take 1 tablet (150 mg total) by mouth once. 10/02/14   Brock Bad, MD  labetalol (NORMODYNE) 200 MG tablet Take 1 tablet (200 mg total) by mouth 3 (three) times daily. 06/29/14   Brock Bad, MD  Prenatal Vit-Fe Fumarate-FA (MULTIVITAMIN-PRENATAL) 27-0.8 MG TABS tablet Take 1 tablet by mouth daily at 12 noon.    [provider]  tinidazole (TINDAMAX) 500 MG tablet Take 2 tablets (1,000 mg total) by mouth daily with breakfast. 10/02/14   Brock Bad, MD    Allergies    Patient has no known allergies.  Review of Systems   Review of Systems  Constitutional: Positive for fever.  Respiratory: Positive for cough.   All other systems reviewed and are negative.   Physical Exam Updated Vital Signs BP (!) 150/63   Pulse 94   Temp 98.3 F (36.8 C) (Oral)   Resp 20   Ht 5\' 11"  (1.803 m)   Wt 128.5 kg   LMP 04/13/2020   SpO2 99%   BMI 39.51 kg/m   Physical Exam Vitals reviewed.  Constitutional:      Appearance: Normal appearance.  HENT:  Nose: Nose normal.     Mouth/Throat:     Mouth: Mucous membranes are moist.  Cardiovascular:     Rate and Rhythm: Normal rate and regular rhythm.     Pulses: Normal pulses.  Pulmonary:     Effort: Pulmonary effort is normal.     Breath sounds: Normal breath sounds.  Abdominal:     General: Abdomen is flat.  Musculoskeletal:        General: Normal range of motion.  Skin:    General: Skin is warm.  Neurological:     General: No focal deficit present.     Mental Status: She is alert.  Psychiatric:        Mood and Affect: Mood normal.     ED Results / Procedures / Treatments   Labs (all labs ordered are listed, but only abnormal results are displayed) Labs Reviewed  RESP PANEL BY RT-PCR (FLU A&B, COVID) ARPGX2 - Abnormal; Notable for the following components:      Result Value   Influenza A by PCR POSITIVE (*)    All other components within normal limits    EKG None  Radiology No results  found.  Procedures Procedures (including critical care time)  Medications Ordered in ED Medications - No data to display  ED Course  I have reviewed the triage vital signs and the nursing notes.  Pertinent labs & imaging results that were available during my care of the patient were reviewed by me and considered in my medical decision making (see chart for details).    MDM Rules/Calculators/A&P                          MDM:  Influenza A is positive.  Pt counseled on results  Final Clinical Impression(s) / ED Diagnoses Final diagnoses:  Influenza A    Rx / DC Orders ED Discharge Orders    None    An After Visit Summary was printed and given to the patient.    Elson Areas, [place] 04/27/20 1710    Pollyann Savoy, MD 04/27/20 1719

## 2020-10-16 ENCOUNTER — Encounter (HOSPITAL_BASED_OUTPATIENT_CLINIC_OR_DEPARTMENT_OTHER): Payer: Self-pay | Admitting: *Deleted

## 2020-10-16 ENCOUNTER — Other Ambulatory Visit: Payer: Self-pay

## 2020-10-16 DIAGNOSIS — R059 Cough, unspecified: Secondary | ICD-10-CM | POA: Insufficient documentation

## 2020-10-16 DIAGNOSIS — I10 Essential (primary) hypertension: Secondary | ICD-10-CM | POA: Diagnosis not present

## 2020-10-16 DIAGNOSIS — Z20822 Contact with and (suspected) exposure to covid-19: Secondary | ICD-10-CM | POA: Insufficient documentation

## 2020-10-16 DIAGNOSIS — Z79899 Other long term (current) drug therapy: Secondary | ICD-10-CM | POA: Diagnosis not present

## 2020-10-16 DIAGNOSIS — R0981 Nasal congestion: Secondary | ICD-10-CM | POA: Insufficient documentation

## 2020-10-16 DIAGNOSIS — Z7722 Contact with and (suspected) exposure to environmental tobacco smoke (acute) (chronic): Secondary | ICD-10-CM | POA: Insufficient documentation

## 2020-10-16 NOTE — ED Triage Notes (Signed)
C/o cough fever, body aches , fatigue x 2 days

## 2020-10-17 ENCOUNTER — Emergency Department (HOSPITAL_BASED_OUTPATIENT_CLINIC_OR_DEPARTMENT_OTHER)
Admission: EM | Admit: 2020-10-17 | Discharge: 2020-10-17 | Disposition: A | Discharge status: Home / Self Care | Payer: Medicaid Other | Attending: Emergency Medicine | Admitting: Emergency Medicine

## 2020-10-17 DIAGNOSIS — J069 Acute upper respiratory infection, unspecified: Secondary | ICD-10-CM

## 2020-10-17 LAB — RESP PANEL BY RT-PCR (FLU A&B, COVID) ARPGX2
Influenza A by PCR: NEGATIVE
Influenza B by PCR: NEGATIVE
SARS Coronavirus 2 by RT PCR: NEGATIVE

## 2020-10-17 MED ORDER — AMOXICILLIN 500 MG PO CAPS: 500.0000 mg | ORAL_CAPSULE | Freq: Three times a day (TID) | ORAL | 0 refills | Status: DC

## 2020-10-17 NOTE — Discharge Instructions (Addendum)
Take over-the-counter medications as needed for relief of symptoms.  The MyChart app will notify you when your COVID test has resulted.  If you are not feeling better in the next 3 to 4 days, fill the prescription for amoxicillin you have been provided with here in the ER.

## 2020-10-17 NOTE — ED Provider Notes (Signed)
[facility] EMERGENCY DEPARTMENT Provider Note   CSN: [id number] Arrival date & time: 10/16/20  2329     History Chief Complaint  Patient presents with  . Cough    Lynn Gallegos is a 35 y.o. female.  Patient is a 35 year old female with no significant past medical history.  She presents today for evaluation of nasal congestion and cough for the past several days.  She denies fevers or chills.  She is here with her daughter who was sick in a similar fashion.  The history is provided by the patient.  Cough Cough characteristics:  Productive Sputum characteristics:  Green Severity:  Moderate Onset quality:  Gradual Duration:  3 days Timing:  Constant Progression:  Worsening Relieved by:  Nothing Worsened by:  Nothing Ineffective treatments:  None tried      Past Medical History:  Diagnosis Date  . Hypertension     Patient Active Problem List   Diagnosis Date Noted  . S/P cesarean section 06/26/2014  . Indication for care in labor or delivery 06/23/2014  . Determine fetal presentation using ultrasound   . [redacted] weeks gestation of pregnancy   . PIH (pregnancy induced hypertension) 06/18/2014    Past Surgical History:  Procedure Laterality Date  . CESAREAN SECTION N/A 06/26/2014   Procedure: CESAREAN SECTION;  Surgeon: Brock Bad, MD;  Location: [facility];  Service: Obstetrics;  Laterality: N/A;  . NO PAST SURGERIES       OB History    Gravida  1   Para  1   Term  1   Preterm      AB      Living  1     SAB      IAB      Ectopic      Multiple  0   Live Births  1           Family History  Problem Relation Age of Onset  . Aneurysm Mother     Social History   Tobacco Use  . Smoking status: Passive Smoke Exposure - Never Smoker  . Smokeless tobacco: Never Used  Substance Use Topics  . Alcohol use: No    Alcohol/week: 0.0 standard drinks  . Drug use: No    Home Medications Prior to Admission medications   Medication Sig  Start Date End Date Taking? Authorizing Provider  AMLODIPINE-ATORVASTATIN PO Take by mouth.    [provider]  fluconazole (DIFLUCAN) 150 MG tablet Take 1 tablet (150 mg total) by mouth once. 10/02/14   Brock Bad, MD  labetalol (NORMODYNE) 200 MG tablet Take 1 tablet (200 mg total) by mouth 3 (three) times daily. 06/29/14   Brock Bad, MD  Prenatal Vit-Fe Fumarate-FA (MULTIVITAMIN-PRENATAL) 27-0.8 MG TABS tablet Take 1 tablet by mouth daily at 12 noon.    [provider]  tinidazole (TINDAMAX) 500 MG tablet Take 2 tablets (1,000 mg total) by mouth daily with breakfast. 10/02/14   Brock Bad, MD    Allergies    Patient has no known allergies.  Review of Systems   Review of Systems  Respiratory: Positive for cough.   All other systems reviewed and are negative.   Physical Exam Updated Vital Signs BP (!) 155/106 (BP Location: Right Arm)   Pulse 86   Temp 98.3 F (36.8 C) (Oral)   Resp 16   Ht 5\' 11"  (1.803 m)   Wt 127 kg   LMP 10/03/2020   SpO2 98%  BMI 39.05 kg/m   Physical Exam Vitals and nursing note reviewed.  Constitutional:      General: She is not in acute distress.    Appearance: She is well-developed. She is not diaphoretic.  HENT:     Head: Normocephalic and atraumatic.     Right Ear: Tympanic membrane normal.     Left Ear: Tympanic membrane normal.     Mouth/Throat:     Mouth: Mucous membranes are moist.     Pharynx: No oropharyngeal exudate or posterior oropharyngeal erythema.  Cardiovascular:     Rate and Rhythm: Normal rate and regular rhythm.     Heart sounds: No murmur heard. No friction rub. No gallop.   Pulmonary:     Effort: Pulmonary effort is normal. No respiratory distress.     Breath sounds: Normal breath sounds. No wheezing.  Abdominal:     General: Bowel sounds are normal. There is no distension.     Palpations: Abdomen is soft.     Tenderness: There is no abdominal tenderness.  Musculoskeletal:         General: Normal range of motion.     Cervical back: Normal range of motion and neck supple.  Skin:    General: Skin is warm and dry.  Neurological:     Mental Status: She is alert and oriented to person, place, and time.     ED Results / Procedures / Treatments   Labs (all labs ordered are listed, but only abnormal results are displayed) Labs Reviewed - No data to display  EKG None  Radiology No results found.  Procedures Procedures   Medications Ordered in ED Medications - No data to display  ED Course  I have reviewed the triage vital signs and the nursing notes.  Pertinent labs & imaging results that were available during my care of the patient were reviewed by me and considered in my medical decision making (see chart for details).    MDM Rules/Calculators/A&P  Patient presenting with URI symptoms that are likely viral in nature.  I will obtain a COVID test.  Patient will be prescribed amoxicillin which she can fill if she is not feeling better in the next 3 to 4 days.  Final Clinical Impression(s) / ED Diagnoses Final diagnoses:  None    Rx / DC Orders ED Discharge Orders    None       Geoffery Lyons, MD 10/17/20 [phone]

## 2021-03-01 LAB — OB RESULTS CONSOLE RUBELLA ANTIBODY, IGM: Rubella: IMMUNE

## 2021-03-01 LAB — OB RESULTS CONSOLE HEPATITIS B SURFACE ANTIGEN: Hepatitis B Surface Ag: NEGATIVE

## 2021-06-16 ENCOUNTER — Other Ambulatory Visit: Payer: Self-pay | Admitting: Obstetrics and Gynecology

## 2021-06-16 DIAGNOSIS — Z3482 Encounter for supervision of other normal pregnancy, second trimester: Secondary | ICD-10-CM

## 2021-06-16 DIAGNOSIS — Z363 Encounter for antenatal screening for malformations: Secondary | ICD-10-CM

## 2021-06-22 ENCOUNTER — Other Ambulatory Visit: Payer: Self-pay

## 2021-06-29 ENCOUNTER — Encounter: Payer: Self-pay | Admitting: *Deleted

## 2021-07-05 ENCOUNTER — Ambulatory Visit: Payer: Medicaid Other | Admitting: *Deleted

## 2021-07-05 ENCOUNTER — Ambulatory Visit: Payer: Medicaid Other | Attending: Obstetrics and Gynecology

## 2021-07-05 ENCOUNTER — Ambulatory Visit: Payer: Medicaid Other | Attending: Obstetrics and Gynecology | Admitting: Obstetrics and Gynecology

## 2021-07-05 ENCOUNTER — Other Ambulatory Visit: Payer: Self-pay

## 2021-07-05 VITALS — BP 132/58 | HR 91

## 2021-07-05 DIAGNOSIS — Z3689 Encounter for other specified antenatal screening: Secondary | ICD-10-CM | POA: Diagnosis not present

## 2021-07-05 DIAGNOSIS — O99212 Obesity complicating pregnancy, second trimester: Secondary | ICD-10-CM | POA: Diagnosis not present

## 2021-07-05 DIAGNOSIS — E668 Other obesity: Secondary | ICD-10-CM

## 2021-07-05 DIAGNOSIS — Z3482 Encounter for supervision of other normal pregnancy, second trimester: Secondary | ICD-10-CM

## 2021-07-05 DIAGNOSIS — O10012 Pre-existing essential hypertension complicating pregnancy, second trimester: Secondary | ICD-10-CM | POA: Diagnosis not present

## 2021-07-05 DIAGNOSIS — O99213 Obesity complicating pregnancy, third trimester: Secondary | ICD-10-CM

## 2021-07-05 DIAGNOSIS — Z3A27 27 weeks gestation of pregnancy: Secondary | ICD-10-CM | POA: Insufficient documentation

## 2021-07-05 DIAGNOSIS — O34219 Maternal care for unspecified type scar from previous cesarean delivery: Secondary | ICD-10-CM | POA: Insufficient documentation

## 2021-07-05 DIAGNOSIS — Z363 Encounter for antenatal screening for malformations: Secondary | ICD-10-CM | POA: Diagnosis present

## 2021-07-05 NOTE — Progress Notes (Signed)
°  Maternal-Fetal Medicine   Name: Lynn Gallegos DOB: [birth year] MRN: [id number] Referring Provider: Pryor Ochoa, MD  I had the pleasure of seeing [facility] at [facility]. She is G2 P1001 at 27-weeks' gestation and is here for fetal anatomy scan. Anatomical survey could not be completed at your office. Patient had opted not to screen for fetal aneuploidies. Past medical history significant for chronic hypertension.  Patient takes nifedipine XL 60 mg daily.  Blood pressures today at our office were 142/82 and 132/58 mmHg. She does not have diabetes or any other chronic medical conditions.  She does not have sickle cell trait. Past surgical history: Cesarean section. Medications: Prenatal vitamins, low-dose aspirin, nifedipine. Allergies: No known drug allergies. Social history: Denies tobacco or drug or alcohol use.  She lives with a partner who is a father of her first child.  Her partner is in good health.  Ultrasound We performed fetal anatomical survey.  Amniotic fluid is normal and good fetal activity seen.  Fetal growth is appropriate for gestational age.  No markers of aneuploidies or obvious fetal structural defects are seen.  Fetal profile, lips and ductal arch could not be visualized because of fetal position. Placenta is anterior and there is no evidence of previa or placenta accreta spectrum. As maternal obesity imposes limitations on resolution of images, fetal anomalies may be missed.  Our concerns include: Chronic hypertension -Adverse outcomes of severe chronic hypertension include maternal stroke, endorgan damage, coagulation disturbances and placental abruption.  Superimposed preeclampsia occurs in 30% of women with chronic hypertension.  I discussed the benefit of low-dose aspirin prophylaxis that helps delaying or preventing preeclampsia. -I discussed the safety profile of antihypertensives.  Nifedipine can be safely given in pregnancy.  It  can be associated with low birthweights.  Alternative medications include nifedipine. -I discussed ultrasound protocol of monitoring fetal growth and antenatal testing. -Timing of delivery: Provided her blood pressures are well controlled, she can be delivered at 39 weeks' gestation.  Early term delivery is an option if hypertension is not well controlled.  Previous cesarean delivery Patient is keen on VBAC.  I discussed the benefits and risks of VBAC.  The scar rupture associated with vaginal delivery is about 1%. I reassured the patient of normal-appearing placenta and that there is no evidence of previa or placenta accreta spectrum.  Recommendations -Fetal growth assessment in 4 weeks -Weekly BPP from [redacted] weeks gestation till delivery. -Delivery at [redacted] weeks gestation provider her blood pressures are well controlled.  Early term delivery (37- or 38-weeks' gestation) may be considered if blood pressures are not well controlled. - Patient prefers to have follow-up ultrasound performed at your office  Thank you for consultation.  If you have any questions or concerns, please contact me the [facility].  Consultation including face-to-face (more than 50%) counseling 30 minutes.

## 2021-07-07 LAB — OB RESULTS CONSOLE HIV ANTIBODY (ROUTINE TESTING): HIV: NONREACTIVE

## 2021-07-28 ENCOUNTER — Encounter: Payer: Self-pay | Admitting: Registered"

## 2021-07-28 ENCOUNTER — Other Ambulatory Visit: Payer: Self-pay

## 2021-07-28 ENCOUNTER — Encounter: Payer: Medicaid Other | Attending: Obstetrics and Gynecology | Admitting: Registered"

## 2021-07-28 DIAGNOSIS — O24419 Gestational diabetes mellitus in pregnancy, unspecified control: Secondary | ICD-10-CM | POA: Diagnosis not present

## 2021-07-28 DIAGNOSIS — Z7984 Long term (current) use of oral hypoglycemic drugs: Secondary | ICD-10-CM | POA: Insufficient documentation

## 2021-07-28 NOTE — Progress Notes (Signed)
Patient was seen on 07/28/21 for Gestational Diabetes self-management class at [facility]. The following learning objectives were met by the patient during this course: ? ?States the definition of Gestational Diabetes ?States why dietary management is important in controlling blood glucose ?Describes the effects each nutrient has on blood glucose levels ?Demonstrates ability to create a balanced meal plan ?Demonstrates carbohydrate counting  ?States when to check blood glucose levels ?Demonstrates proper blood glucose monitoring techniques ?States the effect of stress and exercise on blood glucose levels ?States the importance of limiting caffeine and abstaining from alcohol and smoking ? ?Blood glucose monitor given: patient has meter and checking BG prior  ? ?Patient instructed to monitor glucose levels: ?FBS: 60 - <95; 1 hour: <140; 2 hour: <120 ? ?Patient received handouts: ?Nutrition Diabetes and Pregnancy, including carb counting list ? ?Patient will be seen for follow-up as needed. ?

## 2021-09-07 LAB — OB RESULTS CONSOLE GBS: GBS: NEGATIVE

## 2021-09-14 ENCOUNTER — Inpatient Hospital Stay (EMERGENCY_DEPARTMENT_HOSPITAL)
Admission: AD | Admit: 2021-09-14 | Discharge: 2021-09-14 | Discharge status: Against Medical Advice | Payer: Medicaid Other | Source: Home / Self Care | Attending: Obstetrics and Gynecology | Admitting: Obstetrics and Gynecology

## 2021-09-14 ENCOUNTER — Encounter (HOSPITAL_COMMUNITY): Payer: Self-pay | Admitting: Obstetrics and Gynecology

## 2021-09-14 ENCOUNTER — Other Ambulatory Visit: Payer: Self-pay

## 2021-09-14 DIAGNOSIS — O10913 Unspecified pre-existing hypertension complicating pregnancy, third trimester: Secondary | ICD-10-CM | POA: Diagnosis not present

## 2021-09-14 DIAGNOSIS — O368330 Maternal care for abnormalities of the fetal heart rate or rhythm, third trimester, not applicable or unspecified: Secondary | ICD-10-CM | POA: Insufficient documentation

## 2021-09-14 DIAGNOSIS — O10013 Pre-existing essential hypertension complicating pregnancy, third trimester: Secondary | ICD-10-CM | POA: Insufficient documentation

## 2021-09-14 DIAGNOSIS — Z3A37 37 weeks gestation of pregnancy: Secondary | ICD-10-CM

## 2021-09-14 DIAGNOSIS — Z7982 Long term (current) use of aspirin: Secondary | ICD-10-CM | POA: Insufficient documentation

## 2021-09-14 DIAGNOSIS — O24415 Gestational diabetes mellitus in pregnancy, controlled by oral hypoglycemic drugs: Secondary | ICD-10-CM | POA: Insufficient documentation

## 2021-09-14 LAB — COMPREHENSIVE METABOLIC PANEL
ALT: 47 U/L — ABNORMAL HIGH (ref 0–44)
AST: 55 U/L — ABNORMAL HIGH (ref 15–41)
Albumin: 2.8 g/dL — ABNORMAL LOW (ref 3.5–5.0)
Alkaline Phosphatase: 97 U/L (ref 38–126)
Anion gap: 9 (ref 5–15)
BUN: 10 mg/dL (ref 6–20)
CO2: 22 mmol/L (ref 22–32)
Calcium: 9.5 mg/dL (ref 8.9–10.3)
Chloride: 105 mmol/L (ref 98–111)
Creatinine, Ser: 0.71 mg/dL (ref 0.44–1.00)
GFR, Estimated: >60 mL/min (ref >60)
Glucose, Bld: 112 mg/dL — ABNORMAL HIGH (ref 70–99)
Potassium: 3.3 mmol/L — ABNORMAL LOW (ref 3.5–5.1)
Sodium: 136 mmol/L (ref 135–145)
Total Bilirubin: 0.4 mg/dL (ref 0.3–1.2)
Total Protein: 6.3 g/dL — ABNORMAL LOW (ref 6.5–8.1)

## 2021-09-14 LAB — CBC
HCT: 33.1 % — ABNORMAL LOW (ref 36.0–46.0)
Hemoglobin: 11.2 g/dL — ABNORMAL LOW (ref 12.0–15.0)
MCH: 27.9 pg (ref 26.0–34.0)
MCHC: 33.8 g/dL (ref 30.0–36.0)
MCV: 82.3 fL (ref 80.0–100.0)
Platelets: 227 10*3/uL (ref 150–400)
RBC: 4.02 MIL/uL (ref 3.87–5.11)
RDW: 13.4 % (ref 11.5–15.5)
WBC: 7.5 10*3/uL (ref 4.0–10.5)
nRBC: 0 % (ref 0.0–0.2)

## 2021-09-14 LAB — PROTEIN / CREATININE RATIO, URINE
Creatinine, Urine: 52.78 mg/dL
Total Protein, Urine: <6 mg/dL

## 2021-09-14 MED ORDER — NIFEDIPINE ER OSMOTIC RELEASE 30 MG PO TB24
30.0000 mg | ORAL_TABLET | Freq: Once | ORAL | Status: AC
  Administered 2021-09-14: 30 mg via ORAL
  Filled 2021-09-14: qty 1

## 2021-09-14 MED ORDER — HYDRALAZINE HCL 20 MG/ML IJ SOLN
10.0000 mg | INTRAMUSCULAR | Status: DC | PRN
  Filled 2021-09-14: qty 1

## 2021-09-14 MED ORDER — LACTATED RINGERS IV BOLUS: 1000.0000 mL | Freq: Once | INTRAVENOUS | Status: DC

## 2021-09-14 MED ORDER — LABETALOL HCL 5 MG/ML IV SOLN: 40.0000 mg | INTRAVENOUS | Status: DC | PRN

## 2021-09-14 MED ORDER — LABETALOL HCL 5 MG/ML IV SOLN: 20.0000 mg | INTRAVENOUS | Status: DC | PRN

## 2021-09-14 MED ORDER — HYDRALAZINE HCL 20 MG/ML IJ SOLN
5.0000 mg | INTRAMUSCULAR | Status: DC | PRN
  Administered 2021-09-14: 5 mg via INTRAVENOUS
  Filled 2021-09-14: qty 1

## 2021-09-14 MED ORDER — LACTATED RINGERS IV BOLUS
1000.0000 mL | Freq: Once | INTRAVENOUS | Status: AC
  Administered 2021-09-14: 1000 mL via INTRAVENOUS

## 2021-09-14 NOTE — MAU Note (Signed)
Florentina Addison, RN received report from Abner Greenspan, RN at approximately [phone]. Edwards informed Lynford Humphrey that patient had two elevated BP's and needed IV access for medication protocol, but that patient was in the bathroom crying. Edwards reported BP of 176/88 at 1832 and 161/85 at 1845.  ? ?IV access obtained by Lewis Keats at [place] and medication protocol started. See MAR and flowsheet data for more information.  ?

## 2021-09-14 NOTE — MAU Provider Note (Signed)
?History  ?  ? ?CSN: [id number] ? ?Arrival date and time: 09/14/21 1808 ? ? Event Date/Time  ? First Provider Initiated Contact with Patient 09/14/21 1852   ?  ? ?Chief Complaint  ?Patient presents with  ? observation  ? fetal monitoring  ? ?[name] is a 36 y.o. year old G79P1001 female at [redacted]w[redacted]d weeks gestation who was sent to MAU from [facility] for NRSNT in the office. SHe denies VB, LOF or UCs. Her high risk pregnancy is complicated by GDM and cHTN. She takes Procardia 60 mg in AM and 30 mg hs daily for her cHTN. She is tearful stating, "This is the same thing that happened the last time. They tried to induce me and it didn't work. I ended up with a C/S." ? ? ?OB History   ? ? Gravida  ?2  ? Para  ?1  ? Term  ?1  ? Preterm  ?   ? AB  ?   ? Living  ?1  ?  ? ? SAB  ?   ? IAB  ?   ? Ectopic  ?   ? Multiple  ?0  ? Live Births  ?1  ?   ?  ?  ? ? ?Past Medical History:  ?Diagnosis Date  ? Hypertension   ? ? ?Past Surgical History:  ?Procedure Laterality Date  ? CESAREAN SECTION N/A 06/26/2014  ? Procedure: CESAREAN SECTION;  Surgeon: Brock Bad, MD;  Location: [facility];  Service: Obstetrics;  Laterality: N/A;  ? ? ?Family History  ?Problem Relation Age of Onset  ? Aneurysm Mother   ? Diabetes Father   ? Hypertension Maternal Grandmother   ? Cancer Paternal Grandmother   ? ? ?Social History  ? ?Tobacco Use  ? Smoking status: Never  ?  Passive exposure: Yes  ? Smokeless tobacco: Never  ?Vaping Use  ? Vaping Use: Never used  ?Substance Use Topics  ? Alcohol use: No  ?  Alcohol/week: 0.0 standard drinks  ? Drug use: No  ? ? ?Allergies: No Known Allergies ? ?Medications Prior to Admission  ?Medication Sig Dispense Refill Last Dose  ? aspirin EC 81 MG tablet Take 81 mg by mouth daily. Swallow whole.     ? lisinopril (ZESTRIL) 30 MG tablet Take 30 mg by mouth daily.     ? metFORMIN (GLUMETZA) 500 MG (MOD) 24 hr tablet Take 500 mg by mouth daily with breakfast.     ? AMLODIPINE-ATORVASTATIN PO Take by mouth.      ? Prenatal Vit-Fe Fumarate-FA (MULTIVITAMIN-PRENATAL) 27-0.8 MG TABS tablet Take 1 tablet by mouth daily at 12 noon.     ? ? ?Review of Systems  ?Constitutional: Negative.   ?HENT: Negative.    ?Eyes: Negative.   ?Respiratory: Negative.    ?Cardiovascular: Negative.   ?Gastrointestinal: Negative.   ?Endocrine: Negative.   ?Genitourinary: Negative.   ?Musculoskeletal: Negative.   ?Skin: Negative.   ?Allergic/Immunologic: Negative.   ?Neurological: Negative.   ?Hematological: Negative.   ?Psychiatric/Behavioral: Negative.    ? ?Physical Exam  ? ?Patient Vitals for the past 24 hrs: ? BP Temp Temp src Pulse Resp SpO2 Height Weight  ?09/14/21 2131 (!) 168/75 -- -- (!) 102 -- -- -- --  ?09/14/21 2115 (!) 145/77 -- -- 88 -- 100 % -- --  ?09/14/21 2100 (!) 148/77 -- -- 85 -- 99 % -- --  ?09/14/21 2045 (!) 158/85 -- -- 92 -- 99 % -- --  ?09/14/21  2030 (!) 149/79 -- -- 88 -- 99 % -- --  ?09/14/21 2015 (!) 151/74 -- -- 94 -- 99 % -- --  ?09/14/21 2000 (!) 151/81 -- -- 92 -- 99 % -- --  ?09/14/21 1946 (!) 146/70 -- -- 91 -- -- -- --  ?09/14/21 1940 (!) 150/76 -- -- 90 -- 99 % -- --  ?09/14/21 1930 (!) 152/79 -- -- 89 -- 99 % -- --  ?09/14/21 1927 (!) 155/90 -- -- -- -- 99 % -- --  ?09/14/21 1922 (!) 155/90 -- -- -- -- -- -- --  ?09/14/21 1845 (!) 161/85 -- -- -- -- -- -- --  ?09/14/21 1844 -- -- -- -- -- 99 % -- --  ?09/14/21 1842 -- -- -- -- -- 98 % 5\' 10"  (1.778 m) 131.7 kg  ?09/14/21 1840 -- -- -- -- -- 98 % -- --  ?09/14/21 1835 -- -- -- -- -- 100 % -- --  ?09/14/21 1832 (!) 176/88 98.1 ?F (36.7 ?C) Oral 99 14 -- -- --  ? ? ?Physical Exam ?Vitals and nursing note reviewed. Exam conducted with a chaperone present.  ?Constitutional:   ?   Appearance: Normal appearance. She is obese.  ?Cardiovascular:  ?   Rate and Rhythm: Normal rate and regular rhythm.  ?   Pulses: Normal pulses.  ?   Heart sounds: Normal heart sounds.  ?Pulmonary:  ?   Effort: Pulmonary effort is normal.  ?   Breath sounds: Normal breath sounds.   ?Abdominal:  ?   General: Bowel sounds are normal.  ?   Palpations: Abdomen is soft.  ?Musculoskeletal:     ?   General: Normal range of motion.  ?Skin: ?   General: Skin is warm and dry.  ?Neurological:  ?   General: No focal deficit present.  ?   Mental Status: She is alert and oriented to person, place, and time. Mental status is at baseline.  ?Psychiatric:     ?   Mood and Affect: Mood normal.     ?   Behavior: Behavior normal.     ?   Thought Content: Thought content normal.     ?   Judgment: Judgment normal.  ? ?REACTIVE NST - FHR: 130 bpm / moderate variability / accels present / decels absent / TOCO: none ? ?MAU Course  ?Procedures ? ?MDM ?CCUA ?CBC ?CMP ?P/C Ratio ?Serial BP's  ?LR Bolus 1000 ml x 2 liters ?Apresoline IV Protocol ?Procardia XL 30 mg  ? ?*Consult with Dr. Donavan FoilBass @ 2104 - notified of patient's complaints, assessments, lab & NST results, tx plan admit for IOL for worsening cHTN -- agrees with plan, call Dr. Jackelyn KnifeMeisinger  TC to Dr. Jackelyn KnifeMeisinger @ 2118 - notified of patient's complaints, assessments, lab & NST results, tx plan admit for IOL for worsening cHTN -- agrees with plan  Dr. Jackelyn KnifeMeisinger on the phone with patient @ 2120 to discuss admission recommendation  ? ?Results for orders placed or performed during the hospital encounter of 09/14/21 (from the past 24 hour(s))  ?Comprehensive metabolic panel     Status: Abnormal  ? Collection Time: 09/14/21  6:49 PM  ?Result Value Ref Range  ? Sodium 136 135 - 145 mmol/L  ? Potassium 3.3 (L) 3.5 - 5.1 mmol/L  ? Chloride 105 98 - 111 mmol/L  ? CO2 22 22 - 32 mmol/L  ? Glucose, Bld 112 (H) 70 - 99 mg/dL  ? BUN 10 6 - 20 mg/dL  ?  Creatinine, Ser 0.71 0.44 - 1.00 mg/dL  ? Calcium 9.5 8.9 - 10.3 mg/dL  ? Total Protein 6.3 (L) 6.5 - 8.1 g/dL  ? Albumin 2.8 (L) 3.5 - 5.0 g/dL  ? AST 55 (H) 15 - 41 U/L  ? ALT 47 (H) 0 - 44 U/L  ? Alkaline Phosphatase 97 38 - 126 U/L  ? Total Bilirubin 0.4 0.3 - 1.2 mg/dL  ? GFR, Estimated >60 >60 mL/min  ? Anion gap 9 5 - 15  ?CBC      Status: Abnormal  ? Collection Time: 09/14/21  6:49 PM  ?Result Value Ref Range  ? WBC 7.5 4.0 - 10.5 K/uL  ? RBC 4.02 3.87 - 5.11 MIL/uL  ? Hemoglobin 11.2 (L) 12.0 - 15.0 g/dL  ? HCT 33.1 (L) 36.0 - 46.0 %  ? MCV 82.3 80.0 - 100.0 fL  ? MCH 27.9 26.0 - 34.0 pg  ? MCHC 33.8 30.0 - 36.0 g/dL  ? RDW 13.4 11.5 - 15.5 %  ? Platelets 227 150 - 400 K/uL  ? nRBC 0.0 0.0 - 0.2 %  ?Protein / creatinine ratio, urine     Status: None  ? Collection Time: 09/14/21  6:54 PM  ?Result Value Ref Range  ? Creatinine, Urine 52.78 mg/dL  ? Total Protein, Urine <6 mg/dL  ? Protein Creatinine Ratio RESULT BELOW REPORTABLE RANGE,  ?UNABLE TO CALCULATE.   0.00 - 0.15 mg/mg[Cre]  ? ?@2133 : Patient made RN aware that she desires to go home -- RN advised to call Dr.  Dr. Meisinger's recommendation -- have patient sign out AMA. ? ?Assessment and Plan  ?Chronic hypertension complicating or reason for care during pregnancy, third trimester ?- Per Dr. Jackelyn Knife patient is to call the office in the AM  ?- Advised of the risks of leaving AMA; up to and including death of either her and/or her unborn baby ?- AMA form signed by patient ? ?[redacted] weeks gestation of pregnancy  ? ?Jackelyn Knife, CNM ?09/14/2021, 7:15 PM  ?

## 2021-09-14 NOTE — Progress Notes (Addendum)
Provider in attendance at bedside. Tracing and blood pressures reviewed. Plan of care discussed.  Loss of contact noted. ?Patient was sitting up crying and upset about the possibility of being induced due to her blood pressures. Efm off. All questions was answered by provider. Patient ambulated to bathroom. RN informed patient when comes out the bathroom we would like to get serial blood pressures, blood work, and IV access. Patient verbalized understanding and agreed. ? ?

## 2021-09-14 NOTE — MAU Note (Signed)
Patient arrived from Dr. Helmut Muster office to MAU for NST for non reactive NST in the office. Patient denies vaginal bleeding, leakage of fluid, and or ctx. ?+FM reported.  ?  ?Patient has GDM and CHTN. ?

## 2021-09-14 NOTE — MAU Note (Signed)
RN at bedside to discuss AMA form at patient's request, after speaking to Lavina Hamman, MD and Raelyn Mora, CNM. Patient educated on PIH signs and symptoms and when to return to hospital. Also educated on general labor signs of symptoms of when to return to hospital. Patient verbalized understanding. Patient denied having any questions for the providers. AMA signed and placed in medical records bin.  ?

## 2021-09-15 ENCOUNTER — Encounter (HOSPITAL_COMMUNITY): Payer: Self-pay | Admitting: Obstetrics and Gynecology

## 2021-09-15 ENCOUNTER — Inpatient Hospital Stay (HOSPITAL_COMMUNITY)
Admission: AD | Admit: 2021-09-15 | Discharge: 2021-09-20 | DRG: 788 | Disposition: A | Discharge status: Home / Self Care | Payer: Medicaid Other | Attending: Obstetrics and Gynecology | Admitting: Obstetrics and Gynecology

## 2021-09-15 DIAGNOSIS — Z3A37 37 weeks gestation of pregnancy: Secondary | ICD-10-CM | POA: Diagnosis not present

## 2021-09-15 DIAGNOSIS — O99214 Obesity complicating childbirth: Secondary | ICD-10-CM | POA: Diagnosis present

## 2021-09-15 DIAGNOSIS — O1002 Pre-existing essential hypertension complicating childbirth: Secondary | ICD-10-CM | POA: Diagnosis present

## 2021-09-15 DIAGNOSIS — O34211 Maternal care for low transverse scar from previous cesarean delivery: Secondary | ICD-10-CM | POA: Diagnosis present

## 2021-09-15 DIAGNOSIS — O114 Pre-existing hypertension with pre-eclampsia, complicating childbirth: Secondary | ICD-10-CM | POA: Diagnosis present

## 2021-09-15 DIAGNOSIS — Z98891 History of uterine scar from previous surgery: Secondary | ICD-10-CM

## 2021-09-15 DIAGNOSIS — O119 Pre-existing hypertension with pre-eclampsia, unspecified trimester: Principal | ICD-10-CM | POA: Diagnosis present

## 2021-09-15 DIAGNOSIS — O24425 Gestational diabetes mellitus in childbirth, controlled by oral hypoglycemic drugs: Secondary | ICD-10-CM | POA: Diagnosis present

## 2021-09-15 DIAGNOSIS — O10913 Unspecified pre-existing hypertension complicating pregnancy, third trimester: Secondary | ICD-10-CM | POA: Diagnosis not present

## 2021-09-15 LAB — COMPREHENSIVE METABOLIC PANEL
ALT: 57 U/L — ABNORMAL HIGH (ref 0–44)
AST: 54 U/L — ABNORMAL HIGH (ref 15–41)
Albumin: 2.8 g/dL — ABNORMAL LOW (ref 3.5–5.0)
Alkaline Phosphatase: 96 U/L (ref 38–126)
Anion gap: 9 (ref 5–15)
BUN: 7 mg/dL (ref 6–20)
CO2: 21 mmol/L — ABNORMAL LOW (ref 22–32)
Calcium: 8.9 mg/dL (ref 8.9–10.3)
Chloride: 104 mmol/L (ref 98–111)
Creatinine, Ser: 0.6 mg/dL (ref 0.44–1.00)
GFR, Estimated: >60 mL/min (ref >60)
Glucose, Bld: 95 mg/dL (ref 70–99)
Potassium: 3.5 mmol/L (ref 3.5–5.1)
Sodium: 134 mmol/L — ABNORMAL LOW (ref 135–145)
Total Bilirubin: 0.5 mg/dL (ref 0.3–1.2)
Total Protein: 6.5 g/dL (ref 6.5–8.1)

## 2021-09-15 LAB — URINALYSIS, ROUTINE W REFLEX MICROSCOPIC
Bilirubin Urine: NEGATIVE
Glucose, UA: NEGATIVE mg/dL
Hgb urine dipstick: NEGATIVE
Ketones, ur: 5 mg/dL — AB
Leukocytes,Ua: NEGATIVE
Nitrite: NEGATIVE
Protein, ur: 30 mg/dL — AB
Specific Gravity, Urine: 1.021 (ref 1.005–1.030)
Squamous Epithelial / HPF: >50 — ABNORMAL HIGH (ref 0–5)
pH: 5 (ref 5.0–8.0)

## 2021-09-15 LAB — CBC
HCT: 32.6 % — ABNORMAL LOW (ref 36.0–46.0)
Hemoglobin: 11 g/dL — ABNORMAL LOW (ref 12.0–15.0)
MCH: 27.6 pg (ref 26.0–34.0)
MCHC: 33.7 g/dL (ref 30.0–36.0)
MCV: 81.7 fL (ref 80.0–100.0)
Platelets: 218 10*3/uL (ref 150–400)
RBC: 3.99 MIL/uL (ref 3.87–5.11)
RDW: 13.2 % (ref 11.5–15.5)
WBC: 9.1 10*3/uL (ref 4.0–10.5)
nRBC: 0 % (ref 0.0–0.2)

## 2021-09-15 LAB — PROTEIN / CREATININE RATIO, URINE
Creatinine, Urine: 332.88 mg/dL
Protein Creatinine Ratio: 0.1 mg/mg{Cre} (ref 0.00–0.15)
Total Protein, Urine: 33 mg/dL

## 2021-09-15 LAB — TYPE AND SCREEN
ABO/RH(D): O POS
Antibody Screen: NEGATIVE

## 2021-09-15 MED ORDER — SOD CITRATE-CITRIC ACID 500-334 MG/5ML PO SOLN
30.0000 mL | ORAL | Status: DC | PRN
  Administered 2021-09-16: 30 mL via ORAL
  Filled 2021-09-15: qty 30

## 2021-09-15 MED ORDER — MAGNESIUM SULFATE BOLUS VIA INFUSION
4.0000 g | Freq: Once | INTRAVENOUS | Status: AC
  Administered 2021-09-15: 4 g via INTRAVENOUS
  Filled 2021-09-15: qty 1000

## 2021-09-15 MED ORDER — LABETALOL HCL 5 MG/ML IV SOLN: 80.0000 mg | INTRAVENOUS | Status: DC | PRN

## 2021-09-15 MED ORDER — HYDRALAZINE HCL 20 MG/ML IJ SOLN: 10.0000 mg | INTRAMUSCULAR | Status: DC | PRN

## 2021-09-15 MED ORDER — LABETALOL HCL 5 MG/ML IV SOLN: 20.0000 mg | INTRAVENOUS | Status: DC | PRN

## 2021-09-15 MED ORDER — LACTATED RINGERS IV SOLN: INTRAVENOUS | Status: DC

## 2021-09-15 MED ORDER — LABETALOL HCL 5 MG/ML IV SOLN: 40.0000 mg | INTRAVENOUS | Status: DC | PRN

## 2021-09-15 MED ORDER — MAGNESIUM SULFATE 40 GM/1000ML IV SOLN
1.0000 g/h | INTRAVENOUS | Status: AC
  Administered 2021-09-16 – 2021-09-17 (×2): 2 g/h via INTRAVENOUS
  Filled 2021-09-15 (×3): qty 1000

## 2021-09-15 MED ORDER — OXYCODONE-ACETAMINOPHEN 5-325 MG PO TABS: 1.0000 | ORAL_TABLET | ORAL | Status: DC | PRN

## 2021-09-15 MED ORDER — FENTANYL CITRATE (PF) 100 MCG/2ML IJ SOLN: 50.0000 ug | INTRAMUSCULAR | Status: DC | PRN

## 2021-09-15 MED ORDER — OXYTOCIN BOLUS FROM INFUSION: 333.0000 mL | Freq: Once | INTRAVENOUS | Status: DC

## 2021-09-15 MED ORDER — ONDANSETRON HCL 4 MG/2ML IJ SOLN: 4.0000 mg | Freq: Four times a day (QID) | INTRAMUSCULAR | Status: DC | PRN

## 2021-09-15 MED ORDER — TERBUTALINE SULFATE 1 MG/ML IJ SOLN: 0.2500 mg | Freq: Once | INTRAMUSCULAR | Status: DC | PRN

## 2021-09-15 MED ORDER — LIDOCAINE HCL (PF) 1 % IJ SOLN: 30.0000 mL | INTRAMUSCULAR | Status: DC | PRN

## 2021-09-15 MED ORDER — OXYCODONE-ACETAMINOPHEN 5-325 MG PO TABS: 2.0000 | ORAL_TABLET | ORAL | Status: DC | PRN

## 2021-09-15 MED ORDER — OXYTOCIN-SODIUM CHLORIDE 30-0.9 UT/500ML-% IV SOLN
1.0000 m[IU]/min | INTRAVENOUS | Status: DC
  Administered 2021-09-15: 2 m[IU]/min via INTRAVENOUS
  Filled 2021-09-15: qty 500

## 2021-09-15 MED ORDER — LACTATED RINGERS IV SOLN: 500.0000 mL | INTRAVENOUS | Status: DC | PRN

## 2021-09-15 MED ORDER — ACETAMINOPHEN 325 MG PO TABS: 650.0000 mg | ORAL_TABLET | ORAL | Status: DC | PRN

## 2021-09-15 MED ORDER — OXYTOCIN-SODIUM CHLORIDE 30-0.9 UT/500ML-% IV SOLN: 2.5000 [IU]/h | INTRAVENOUS | Status: DC

## 2021-09-15 NOTE — MAU Note (Signed)
Lynn Gallegos is a 36 y.o. at [redacted]w[redacted]d here in MAU reporting:  Pt reports she was seen in the office today and was told to come to MAU for her c-section. Pt denies any complaints.  ? ?Onset of complaint: denies ?Pain score: denies ?There were no vitals filed for this visit.   ? ?Lab orders placed from triage: UA ?

## 2021-09-15 NOTE — H&P (Signed)
Lynn Gallegos is a 36 y.o. female presenting for Hypertension ? ?36 year old gravida 2 para 1-0-0-1 37+2 weeks estimated gestational age presents to labor and delivery admission for chronic hypertension with superimposed preeclampsia.  The patient's pregnancy has been complicated by chronic hypertension.  Her blood pressures have been well controlled on Procardia 60 mg QAM, 30mg  QHS.  She was seen in the office yesterday, may second for routine prenatal care.  She had a nonstress test which was reassuring but reactive.  She had a biophysical profile which was 6 out of 8.  She was sent to MAU for extended monitoring.  In maternity admissions her blood pressures were found to be significantly elevated in the severe range.  Additionally, liver function tests were mildly elevated.  The patient was advised of necessity for admission for delivery.  The patient was offered a trial of labor versus repeat cesarean section.  Patient declined maternity admission last medical advice.  She was seen again in her prenatal office today where her blood pressures remained elevated.  She agreed to admission for delivery.  She strongly desires a trial of labor.  Initially she was advised to leave for cesarean delivery due to superimposed preeclampsia and unfavorable cervix.  Currently, fetal tracing is reactive and her blood pressures are in the mild range.  Given her strong desire for a trial of labor will proceed with induction of labor with Pitocin.  Risks/benefits/alternatives were discussed with the patient and she wishes to proceed. ? ?Pregnancy Problems ?1) chronic hypertension: Procardia 60 mg XL QAM, 30mg  QHS ?2) gestational diabetes: Metformin 1000 mg nightly ?3) maternal obesity, BMI 41 ?4) history of prior cesarean section.  Failed induction of labor.  Induced due to pregnancy-induced hypertension.  Failed cervical dilation past 4 cm ?OB History   ? ? Gravida  ?2  ? Para  ?1  ? Term  ?1  ? Preterm  ?   ? AB  ?   ? Living  ?1   ?  ? ? SAB  ?   ? IAB  ?   ? Ectopic  ?   ? Multiple  ?0  ? Live Births  ?1  ?   ?  ?  ? ?Past Medical History:  ?Diagnosis Date  ? Hypertension   ? ?Past Surgical History:  ?Procedure Laterality Date  ? CESAREAN SECTION N/A 06/26/2014  ? Procedure: CESAREAN SECTION;  Surgeon: , MD;  Location: [facility];  Service: Obstetrics;  Laterality: N/A;  ? ?Family History: family history includes Aneurysm in her mother; Cancer in her paternal grandmother; Diabetes in her father; Hypertension in her maternal grandmother. ?Social History:  reports that she has never smoked. She has been exposed to tobacco smoke. She has never used smokeless tobacco. She reports that she does not drink alcohol and does not use drugs. ? ? ?  ?Maternal Diabetes: Yes:  Diabetes Type:  Insulin/Medication controlled ?Genetic Screening: Declined ?Maternal Ultrasounds/Referrals: Normal ?Fetal Ultrasounds or other Referrals:  None ?Maternal Substance Abuse:  No ?Significant Maternal Medications:  Meds include: Other:  ?Significant Maternal Lab Results:  None ?Other Comments:  None ? ?Review of Systems ?History ?Dilation: Closed ?Effacement (%): Thick ?Station: -3 ?Exam by:: Alexis Brock Bad RN ?Blood pressure (!) 149/69, pulse 85, temperature 98 ?F (36.7 ?C), temperature source Oral, resp. rate 16, height 5\' 10"  (1.778 m), weight 130 kg, last menstrual period 12/28/2020, SpO2 98 %, currently breastfeeding. ?Exam ?Physical Exam  ?Prenatal labs: ?ABO, Rh: --/--/O POS (05/03 1432) ?Antibody:  NEG (05/03 1432) ?Rubella:  Immune ?RPR:   NR ?HBsAg:   Neg ?HIV:   Nr ?GBS:   Neg ? ?Results for orders placed or performed during the hospital encounter of 09/15/21 (from the past 24 hour(s))  ?Urinalysis, Routine w reflex microscopic Urine, Clean Catch     Status: Abnormal  ? Collection Time: 09/15/21  1:48 PM  ?Result Value Ref Range  ? Color, Urine AMBER (A) YELLOW  ? APPearance CLOUDY (A) CLEAR  ? Specific Gravity, Urine 1.021 1.005 - 1.030  ? pH 5.0  5.0 - 8.0  ? Glucose, UA NEGATIVE NEGATIVE mg/dL  ? Hgb urine dipstick NEGATIVE NEGATIVE  ? Bilirubin Urine NEGATIVE NEGATIVE  ? Ketones, ur 5 (A) NEGATIVE mg/dL  ? Protein, ur 30 (A) NEGATIVE mg/dL  ? Nitrite NEGATIVE NEGATIVE  ? Leukocytes,Ua NEGATIVE NEGATIVE  ? RBC / HPF 0-5 0 - 5 RBC/hpf  ? WBC, UA 6-10 0 - 5 WBC/hpf  ? Bacteria, UA FEW (A) NONE SEEN  ? Squamous Epithelial / LPF >50 (H) 0 - 5  ? Mucus PRESENT   ?Protein / creatinine ratio, urine     Status: None  ? Collection Time: 09/15/21  1:48 PM  ?Result Value Ref Range  ? Creatinine, Urine 332.88 mg/dL  ? Total Protein, Urine 33 mg/dL  ? Protein Creatinine Ratio 0.10 0.00 - 0.15 mg/mg[Cre]  ?Comprehensive metabolic panel     Status: Abnormal  ? Collection Time: 09/15/21  2:32 PM  ?Result Value Ref Range  ? Sodium 134 (L) 135 - 145 mmol/L  ? Potassium 3.5 3.5 - 5.1 mmol/L  ? Chloride 104 98 - 111 mmol/L  ? CO2 21 (L) 22 - 32 mmol/L  ? Glucose, Bld 95 70 - 99 mg/dL  ? BUN 7 6 - 20 mg/dL  ? Creatinine, Ser 0.60 0.44 - 1.00 mg/dL  ? Calcium 8.9 8.9 - 10.3 mg/dL  ? Total Protein 6.5 6.5 - 8.1 g/dL  ? Albumin 2.8 (L) 3.5 - 5.0 g/dL  ? AST 54 (H) 15 - 41 U/L  ? ALT 57 (H) 0 - 44 U/L  ? Alkaline Phosphatase 96 38 - 126 U/L  ? Total Bilirubin 0.5 0.3 - 1.2 mg/dL  ? GFR, Estimated >60 >60 mL/min  ? Anion gap 9 5 - 15  ?CBC     Status: Abnormal  ? Collection Time: 09/15/21  2:32 PM  ?Result Value Ref Range  ? WBC 9.1 4.0 - 10.5 K/uL  ? RBC 3.99 3.87 - 5.11 MIL/uL  ? Hemoglobin 11.0 (L) 12.0 - 15.0 g/dL  ? HCT 32.6 (L) 36.0 - 46.0 %  ? MCV 81.7 80.0 - 100.0 fL  ? MCH 27.6 26.0 - 34.0 pg  ? MCHC 33.7 30.0 - 36.0 g/dL  ? RDW 13.2 11.5 - 15.5 %  ? Platelets 218 150 - 400 K/uL  ? nRBC 0.0 0.0 - 0.2 %  ?Type and screen [facility]     Status: None  ? Collection Time: 09/15/21  2:32 PM  ?Result Value Ref Range  ? ABO/RH(D) O POS   ? Antibody Screen NEG   ? Sample Expiration    ?  09/18/2021,2359 ?Performed at [facility], [address]  [place], [place] [address] ?  ? ? ? ?Assessment/Plan: ?1) admit ?2) initiate induction with Pitocin.  Will attempt to place cervical Foley catheter in the near future.  Currently, cervix is closed and unable to place Foley at this time. ?3)  magnesium sulfate for seizure prophylaxis due to superimposed preeclampsia. ?4) labetalol per protocol ?5) continue scheduled Procardia ?6) CBGs every 4 hours while latent labor, every hour while actively laboring  ? ?Waynard Reeds ?09/15/2021, 5:47 PM ? ? ? ? ?

## 2021-09-15 NOTE — MAU Provider Note (Signed)
Event Date/Time  ?First Provider Initiated Contact with Patient 09/15/21 1400   ? ?S ?[name] is a 36 y.o. G2P1001 patient who presents to MAU today for direct admission. Indication: Chronic Hypertension with Superimposed Preeclampsia. Pregnancy is also c/b A2GDM (Metformin). Patient states her baby was about 53% on her most recent grown scan. She denies headache, visual disturbances, RUQ/epigastric pain, new onset swelling or weight gain. ? ?On arrival to MAU patient verbalizes that she is not comfortable with the plan for repeat cesarean. She desires TOLAC and states she has signed a TOLAC consent.  ? ?Patient receives care with [facility].  ? ?O ?BP (!) 153/85   Pulse 99   Temp 98 ?F (36.7 ?C)   Resp 16   LMP 12/28/2020   SpO2 98%   ? ?Physical Exam ?Vitals and nursing note reviewed. Exam conducted with a chaperone present.  ?Constitutional:   ?   Appearance: Normal appearance.  ?Cardiovascular:  ?   Rate and Rhythm: Normal rate.  ?   Pulses: Normal pulses.  ?Pulmonary:  ?   Effort: Pulmonary effort is normal.  ?Abdominal:  ?   Comments: Gravid  ?Neurological:  ?   Mental Status: She is alert.  ? ? ?A ?Medical screening exam complete ?Cat I tracing: baseline 125, mod var, + accels, no decels ?Toco: quiet ?Patient desires TOLAC, discussed with Dr. Tenny Craw ? ?P ?Per Dr. Tenny Craw, admit to L&D for TOLAC, initiate Magnesium Sulfate infusion on L&D  ? ?Thalia Bloodgood, CNM ?09/15/2021 2:43 PM  ?

## 2021-09-16 ENCOUNTER — Inpatient Hospital Stay (HOSPITAL_COMMUNITY): Payer: Medicaid Other | Admitting: Anesthesiology

## 2021-09-16 ENCOUNTER — Encounter (HOSPITAL_COMMUNITY)
Admission: AD | Disposition: A | Discharge status: Home / Self Care | Payer: Self-pay | Source: Home / Self Care | Attending: Obstetrics and Gynecology

## 2021-09-16 ENCOUNTER — Encounter (HOSPITAL_COMMUNITY): Payer: Self-pay | Admitting: Obstetrics and Gynecology

## 2021-09-16 DIAGNOSIS — O34211 Maternal care for low transverse scar from previous cesarean delivery: Secondary | ICD-10-CM

## 2021-09-16 DIAGNOSIS — O1002 Pre-existing essential hypertension complicating childbirth: Secondary | ICD-10-CM

## 2021-09-16 DIAGNOSIS — Z3A37 37 weeks gestation of pregnancy: Secondary | ICD-10-CM

## 2021-09-16 DIAGNOSIS — Z98891 History of uterine scar from previous surgery: Secondary | ICD-10-CM

## 2021-09-16 DIAGNOSIS — O114 Pre-existing hypertension with pre-eclampsia, complicating childbirth: Secondary | ICD-10-CM

## 2021-09-16 LAB — CBC WITH DIFFERENTIAL/PLATELET
Abs Immature Granulocytes: 0.03 10*3/uL (ref 0.00–0.07)
Basophils Absolute: 0 10*3/uL (ref 0.0–0.1)
Basophils Relative: 0 %
Eosinophils Absolute: 0 10*3/uL (ref 0.0–0.5)
Eosinophils Relative: 1 %
HCT: 35.6 % — ABNORMAL LOW (ref 36.0–46.0)
Hemoglobin: 11.9 g/dL — ABNORMAL LOW (ref 12.0–15.0)
Immature Granulocytes: 0 %
Lymphocytes Relative: 18 %
Lymphs Abs: 1.3 10*3/uL (ref 0.7–4.0)
MCH: 27.3 pg (ref 26.0–34.0)
MCHC: 33.4 g/dL (ref 30.0–36.0)
MCV: 81.7 fL (ref 80.0–100.0)
Monocytes Absolute: 0.3 10*3/uL (ref 0.1–1.0)
Monocytes Relative: 4 %
Neutro Abs: 5.8 10*3/uL (ref 1.7–7.7)
Neutrophils Relative %: 77 %
Platelets: 211 10*3/uL (ref 150–400)
RBC: 4.36 MIL/uL (ref 3.87–5.11)
RDW: 13.3 % (ref 11.5–15.5)
WBC: 7.5 10*3/uL (ref 4.0–10.5)
nRBC: 0 % (ref 0.0–0.2)

## 2021-09-16 LAB — RPR: RPR Ser Ql: NONREACTIVE

## 2021-09-16 LAB — COMPREHENSIVE METABOLIC PANEL
ALT: 78 U/L — ABNORMAL HIGH (ref 0–44)
ALT: 78 U/L — ABNORMAL HIGH (ref 0–44)
AST: 71 U/L — ABNORMAL HIGH (ref 15–41)
AST: 73 U/L — ABNORMAL HIGH (ref 15–41)
Albumin: 2.9 g/dL — ABNORMAL LOW (ref 3.5–5.0)
Albumin: 2.8 g/dL — ABNORMAL LOW (ref 3.5–5.0)
Alkaline Phosphatase: 103 U/L (ref 38–126)
Alkaline Phosphatase: 100 U/L (ref 38–126)
Anion gap: 8 (ref 5–15)
Anion gap: 6 (ref 5–15)
BUN: <5 mg/dL — ABNORMAL LOW (ref 6–20)
BUN: <5 mg/dL — ABNORMAL LOW (ref 6–20)
CO2: 24 mmol/L (ref 22–32)
CO2: 24 mmol/L (ref 22–32)
Calcium: 7.9 mg/dL — ABNORMAL LOW (ref 8.9–10.3)
Calcium: 7.4 mg/dL — ABNORMAL LOW (ref 8.9–10.3)
Chloride: 104 mmol/L (ref 98–111)
Chloride: 102 mmol/L (ref 98–111)
Creatinine, Ser: 0.56 mg/dL (ref 0.44–1.00)
Creatinine, Ser: 0.67 mg/dL (ref 0.44–1.00)
GFR, Estimated: >60 mL/min (ref >60)
GFR, Estimated: >60 mL/min (ref >60)
Glucose, Bld: 108 mg/dL — ABNORMAL HIGH (ref 70–99)
Glucose, Bld: 85 mg/dL (ref 70–99)
Potassium: 3.1 mmol/L — ABNORMAL LOW (ref 3.5–5.1)
Potassium: 3.2 mmol/L — ABNORMAL LOW (ref 3.5–5.1)
Sodium: 136 mmol/L (ref 135–145)
Sodium: 132 mmol/L — ABNORMAL LOW (ref 135–145)
Total Bilirubin: 0.3 mg/dL (ref 0.3–1.2)
Total Bilirubin: 0.5 mg/dL (ref 0.3–1.2)
Total Protein: 6.9 g/dL (ref 6.5–8.1)
Total Protein: 6.6 g/dL (ref 6.5–8.1)

## 2021-09-16 LAB — GLUCOSE, CAPILLARY
Glucose-Capillary: 93 mg/dL (ref 70–99)
Glucose-Capillary: 76 mg/dL (ref 70–99)

## 2021-09-16 LAB — CBC
HCT: 33.9 % — ABNORMAL LOW (ref 36.0–46.0)
Hemoglobin: 11.1 g/dL — ABNORMAL LOW (ref 12.0–15.0)
MCH: 27.1 pg (ref 26.0–34.0)
MCHC: 32.7 g/dL (ref 30.0–36.0)
MCV: 82.9 fL (ref 80.0–100.0)
Platelets: 215 10*3/uL (ref 150–400)
RBC: 4.09 MIL/uL (ref 3.87–5.11)
RDW: 13.5 % (ref 11.5–15.5)
WBC: 6.8 10*3/uL (ref 4.0–10.5)
nRBC: 0 % (ref 0.0–0.2)

## 2021-09-16 SURGERY — Surgical Case
Anesthesia: Spinal

## 2021-09-16 MED ORDER — PHENYLEPHRINE 80 MCG/ML (10ML) SYRINGE FOR IV PUSH (FOR BLOOD PRESSURE SUPPORT)
PREFILLED_SYRINGE | INTRAVENOUS | Status: AC
  Filled 2021-09-16: qty 10

## 2021-09-16 MED ORDER — SODIUM CHLORIDE 0.9% FLUSH: 3.0000 mL | INTRAVENOUS | Status: DC | PRN

## 2021-09-16 MED ORDER — CEFAZOLIN SODIUM 10 G IJ SOLR
INTRAMUSCULAR | Status: DC | PRN
  Administered 2021-09-16: 3 g via INTRAVENOUS

## 2021-09-16 MED ORDER — KETOROLAC TROMETHAMINE 30 MG/ML IJ SOLN
INTRAMUSCULAR | Status: AC
  Filled 2021-09-16: qty 1

## 2021-09-16 MED ORDER — ZOLPIDEM TARTRATE 5 MG PO TABS: 5.0000 mg | ORAL_TABLET | Freq: Every evening | ORAL | Status: DC | PRN

## 2021-09-16 MED ORDER — TRANEXAMIC ACID-NACL 1000-0.7 MG/100ML-% IV SOLN
INTRAVENOUS | Status: AC
  Filled 2021-09-16: qty 100

## 2021-09-16 MED ORDER — OXYTOCIN-SODIUM CHLORIDE 30-0.9 UT/500ML-% IV SOLN
INTRAVENOUS | Status: AC
  Filled 2021-09-16: qty 1000

## 2021-09-16 MED ORDER — SIMETHICONE 80 MG PO CHEW
80.0000 mg | CHEWABLE_TABLET | Freq: Three times a day (TID) | ORAL | Status: DC
  Administered 2021-09-17 – 2021-09-20 (×9): 80 mg via ORAL
  Filled 2021-09-16 (×9): qty 1

## 2021-09-16 MED ORDER — ENOXAPARIN SODIUM 80 MG/0.8ML IJ SOSY
70.0000 mg | PREFILLED_SYRINGE | INTRAMUSCULAR | Status: DC
  Administered 2021-09-17 – 2021-09-20 (×4): 70 mg via SUBCUTANEOUS
  Filled 2021-09-16 (×4): qty 0.8

## 2021-09-16 MED ORDER — DIBUCAINE (PERIANAL) 1 % EX OINT: 1.0000 "application " | TOPICAL_OINTMENT | CUTANEOUS | Status: DC | PRN

## 2021-09-16 MED ORDER — MORPHINE SULFATE (PF) 0.5 MG/ML IJ SOLN
INTRAMUSCULAR | Status: AC
  Filled 2021-09-16: qty 10

## 2021-09-16 MED ORDER — DIPHENHYDRAMINE HCL 25 MG PO CAPS: 25.0000 mg | ORAL_CAPSULE | Freq: Four times a day (QID) | ORAL | Status: DC | PRN

## 2021-09-16 MED ORDER — ONDANSETRON HCL 4 MG/2ML IJ SOLN: 4.0000 mg | Freq: Three times a day (TID) | INTRAMUSCULAR | Status: DC | PRN

## 2021-09-16 MED ORDER — KETOROLAC TROMETHAMINE 30 MG/ML IJ SOLN: 30.0000 mg | Freq: Four times a day (QID) | INTRAMUSCULAR | Status: AC | PRN

## 2021-09-16 MED ORDER — ACETAMINOPHEN 10 MG/ML IV SOLN
INTRAVENOUS | Status: AC
  Filled 2021-09-16: qty 100

## 2021-09-16 MED ORDER — ONDANSETRON HCL 4 MG/2ML IJ SOLN
4.0000 mg | Freq: Once | INTRAMUSCULAR | Status: AC
  Administered 2021-09-16: 4 mg via INTRAVENOUS

## 2021-09-16 MED ORDER — OXYCODONE HCL 5 MG PO TABS
5.0000 mg | ORAL_TABLET | ORAL | Status: DC | PRN
  Administered 2021-09-18 – 2021-09-20 (×4): 5 mg via ORAL
  Filled 2021-09-16 (×4): qty 1

## 2021-09-16 MED ORDER — PHENYLEPHRINE HCL (PRESSORS) 10 MG/ML IV SOLN
INTRAVENOUS | Status: DC | PRN
  Administered 2021-09-16 (×2): 80 ug via INTRAVENOUS

## 2021-09-16 MED ORDER — KETOROLAC TROMETHAMINE 30 MG/ML IJ SOLN
30.0000 mg | Freq: Once | INTRAMUSCULAR | Status: AC
  Administered 2021-09-16: 30 mg via INTRAVENOUS

## 2021-09-16 MED ORDER — FENTANYL CITRATE (PF) 100 MCG/2ML IJ SOLN
INTRAMUSCULAR | Status: AC
  Filled 2021-09-16: qty 2

## 2021-09-16 MED ORDER — NIFEDIPINE ER OSMOTIC RELEASE 30 MG PO TB24
30.0000 mg | ORAL_TABLET | Freq: Every day | ORAL | Status: DC
  Administered 2021-09-16 – 2021-09-19 (×4): 30 mg via ORAL
  Filled 2021-09-16 (×4): qty 1

## 2021-09-16 MED ORDER — DEXAMETHASONE SODIUM PHOSPHATE 4 MG/ML IJ SOLN
INTRAMUSCULAR | Status: AC
  Filled 2021-09-16: qty 1

## 2021-09-16 MED ORDER — SENNOSIDES-DOCUSATE SODIUM 8.6-50 MG PO TABS
2.0000 | ORAL_TABLET | Freq: Every day | ORAL | Status: DC
  Administered 2021-09-17 – 2021-09-19 (×3): 2 via ORAL
  Filled 2021-09-16 (×3): qty 2

## 2021-09-16 MED ORDER — METOCLOPRAMIDE HCL 5 MG/ML IJ SOLN
INTRAMUSCULAR | Status: AC
  Filled 2021-09-16: qty 2

## 2021-09-16 MED ORDER — METOCLOPRAMIDE HCL 5 MG/ML IJ SOLN
INTRAMUSCULAR | Status: DC | PRN
  Administered 2021-09-16: 10 mg via INTRAVENOUS

## 2021-09-16 MED ORDER — ACETAMINOPHEN 500 MG PO TABS
1000.0000 mg | ORAL_TABLET | Freq: Four times a day (QID) | ORAL | Status: DC
  Administered 2021-09-16 – 2021-09-20 (×12): 1000 mg via ORAL
  Filled 2021-09-16 (×13): qty 2

## 2021-09-16 MED ORDER — ONDANSETRON HCL 4 MG/2ML IJ SOLN
INTRAMUSCULAR | Status: DC | PRN
  Administered 2021-09-16: 4 mg via INTRAVENOUS

## 2021-09-16 MED ORDER — OXYTOCIN-SODIUM CHLORIDE 30-0.9 UT/500ML-% IV SOLN
INTRAVENOUS | Status: DC | PRN
  Administered 2021-09-16: 30 [IU] via INTRAVENOUS

## 2021-09-16 MED ORDER — NALOXONE HCL 0.4 MG/ML IJ SOLN: 0.4000 mg | INTRAMUSCULAR | Status: DC | PRN

## 2021-09-16 MED ORDER — PRENATAL MULTIVITAMIN CH
1.0000 | ORAL_TABLET | Freq: Every day | ORAL | Status: DC
  Administered 2021-09-17 – 2021-09-19 (×3): 1 via ORAL
  Filled 2021-09-16 (×3): qty 1

## 2021-09-16 MED ORDER — OXYTOCIN-SODIUM CHLORIDE 30-0.9 UT/500ML-% IV SOLN
2.5000 [IU]/h | INTRAVENOUS | Status: AC
  Administered 2021-09-16: 2.5 [IU]/h via INTRAVENOUS

## 2021-09-16 MED ORDER — IBUPROFEN 600 MG PO TABS
600.0000 mg | ORAL_TABLET | Freq: Four times a day (QID) | ORAL | Status: AC
  Administered 2021-09-16 – 2021-09-19 (×11): 600 mg via ORAL
  Filled 2021-09-16 (×13): qty 1

## 2021-09-16 MED ORDER — WITCH HAZEL-GLYCERIN EX PADS: 1.0000 "application " | MEDICATED_PAD | CUTANEOUS | Status: DC | PRN

## 2021-09-16 MED ORDER — SODIUM CHLORIDE 0.9 % IV SOLN
12.5000 mg | Freq: Once | INTRAVENOUS | Status: AC
  Administered 2021-09-16: 12.5 mg via INTRAVENOUS
  Filled 2021-09-16: qty 0.5

## 2021-09-16 MED ORDER — MORPHINE SULFATE (PF) 0.5 MG/ML IJ SOLN
INTRAMUSCULAR | Status: DC | PRN
  Administered 2021-09-16: .15 mg via INTRATHECAL

## 2021-09-16 MED ORDER — BUPIVACAINE IN DEXTROSE 0.75-8.25 % IT SOLN
INTRATHECAL | Status: DC | PRN
  Administered 2021-09-16: 1.6 mL via INTRATHECAL

## 2021-09-16 MED ORDER — LACTATED RINGERS IV SOLN: INTRAVENOUS | Status: DC

## 2021-09-16 MED ORDER — PHENYLEPHRINE HCL-NACL 20-0.9 MG/250ML-% IV SOLN
INTRAVENOUS | Status: DC | PRN
  Administered 2021-09-16: 60 ug/min via INTRAVENOUS

## 2021-09-16 MED ORDER — EPHEDRINE 5 MG/ML INJ
INTRAVENOUS | Status: AC
  Filled 2021-09-16: qty 5

## 2021-09-16 MED ORDER — SIMETHICONE 80 MG PO CHEW: 80.0000 mg | CHEWABLE_TABLET | ORAL | Status: DC | PRN

## 2021-09-16 MED ORDER — NALOXONE HCL 4 MG/10ML IJ SOLN
1.0000 ug/kg/h | INTRAVENOUS | Status: DC | PRN
  Filled 2021-09-16: qty 5

## 2021-09-16 MED ORDER — FENTANYL CITRATE (PF) 100 MCG/2ML IJ SOLN
INTRAMUSCULAR | Status: DC | PRN
Start: 2021-09-16 — End: 2021-09-16
  Administered 2021-09-16: 15 ug via INTRATHECAL

## 2021-09-16 MED ORDER — EPHEDRINE SULFATE (PRESSORS) 50 MG/ML IJ SOLN
INTRAMUSCULAR | Status: DC | PRN
  Administered 2021-09-16 (×2): 5 mg via INTRAVENOUS

## 2021-09-16 MED ORDER — TRANEXAMIC ACID-NACL 1000-0.7 MG/100ML-% IV SOLN
INTRAVENOUS | Status: DC | PRN
  Administered 2021-09-16: 1000 mg via INTRAVENOUS

## 2021-09-16 MED ORDER — ACETAMINOPHEN 10 MG/ML IV SOLN
INTRAVENOUS | Status: DC | PRN
  Administered 2021-09-16: 1000 mg via INTRAVENOUS

## 2021-09-16 MED ORDER — DROPERIDOL 2.5 MG/ML IJ SOLN: 0.6250 mg | Freq: Once | INTRAMUSCULAR | Status: DC | PRN

## 2021-09-16 MED ORDER — ONDANSETRON HCL 4 MG/2ML IJ SOLN
INTRAMUSCULAR | Status: AC
  Filled 2021-09-16: qty 2

## 2021-09-16 MED ORDER — MENTHOL 3 MG MT LOZG: 1.0000 | LOZENGE | OROMUCOSAL | Status: DC | PRN

## 2021-09-16 MED ORDER — DIPHENHYDRAMINE HCL 50 MG/ML IJ SOLN: 12.5000 mg | INTRAMUSCULAR | Status: DC | PRN

## 2021-09-16 MED ORDER — FENTANYL CITRATE (PF) 100 MCG/2ML IJ SOLN: 25.0000 ug | INTRAMUSCULAR | Status: DC | PRN

## 2021-09-16 MED ORDER — DIPHENHYDRAMINE HCL 25 MG PO CAPS: 25.0000 mg | ORAL_CAPSULE | ORAL | Status: DC | PRN

## 2021-09-16 MED ORDER — ONDANSETRON HCL 4 MG PO TABS
ORAL_TABLET | ORAL | Status: AC
  Filled 2021-09-16: qty 1

## 2021-09-16 MED ORDER — TETANUS-DIPHTH-ACELL PERTUSSIS 5-2.5-18.5 LF-MCG/0.5 IM SUSY: 0.5000 mL | PREFILLED_SYRINGE | Freq: Once | INTRAMUSCULAR | Status: DC

## 2021-09-16 MED ORDER — SODIUM CHLORIDE 0.9 % IV SOLN: INTRAVENOUS | Status: DC | PRN

## 2021-09-16 MED ORDER — COCONUT OIL OIL
1.0000 "application " | TOPICAL_OIL | Status: DC | PRN
  Administered 2021-09-18: 1 via TOPICAL

## 2021-09-16 MED ORDER — NIFEDIPINE ER OSMOTIC RELEASE 60 MG PO TB24
60.0000 mg | ORAL_TABLET | Freq: Every morning | ORAL | Status: DC
  Administered 2021-09-17 – 2021-09-20 (×4): 60 mg via ORAL
  Filled 2021-09-16 (×4): qty 1

## 2021-09-16 SURGICAL SUPPLY — 34 items
BENZOIN TINCTURE PRP APPL 2/3 (GAUZE/BANDAGES/DRESSINGS) ×1 IMPLANT
CHLORAPREP W/TINT 26ML (MISCELLANEOUS) ×4 IMPLANT
CLAMP CORD UMBIL (MISCELLANEOUS) ×2 IMPLANT
CLOSURE STERI STRIP 1/2 X4 (GAUZE/BANDAGES/DRESSINGS) ×1 IMPLANT
CLOTH BEACON ORANGE TIMEOUT ST (SAFETY) ×2 IMPLANT
DRSG OPSITE POSTOP 4X10 (GAUZE/BANDAGES/DRESSINGS) ×2 IMPLANT
ELECT REM PT RETURN 9FT ADLT (ELECTROSURGICAL) ×2
ELECTRODE REM PT RTRN 9FT ADLT (ELECTROSURGICAL) ×1 IMPLANT
EXTRACTOR VACUUM KIWI (MISCELLANEOUS) IMPLANT
GLOVE BIO SURGEON STRL SZ 6.5 (GLOVE) ×2 IMPLANT
GLOVE BIOGEL PI IND STRL 7.0 (GLOVE) ×1 IMPLANT
GLOVE BIOGEL PI INDICATOR 7.0 (GLOVE) ×1
GOWN STRL REUS W/TWL LRG LVL3 (GOWN DISPOSABLE) ×4 IMPLANT
KIT ABG SYR 3ML LUER SLIP (SYRINGE) IMPLANT
NDL HYPO 25X5/8 SAFETYGLIDE (NEEDLE) IMPLANT
NEEDLE HYPO 25X5/8 SAFETYGLIDE (NEEDLE) IMPLANT
NS IRRIG 1000ML POUR BTL (IV SOLUTION) ×2 IMPLANT
PACK C SECTION WH (CUSTOM PROCEDURE TRAY) ×2 IMPLANT
PAD OB MATERNITY 4.3X12.25 (PERSONAL CARE ITEMS) ×2 IMPLANT
RTRCTR C-SECT PINK 25CM LRG (MISCELLANEOUS) ×2 IMPLANT
STRIP CLOSURE SKIN 1/2X4 (GAUZE/BANDAGES/DRESSINGS) IMPLANT
SUT CHROMIC 1 CTX 36 (SUTURE) ×4 IMPLANT
SUT PLAIN 0 NONE (SUTURE) IMPLANT
SUT PLAIN 2 0 XLH (SUTURE) ×2 IMPLANT
SUT VIC AB 0 CT1 27 (SUTURE) ×2
SUT VIC AB 0 CT1 27XBRD ANBCTR (SUTURE) ×2 IMPLANT
SUT VIC AB 2-0 CT1 27 (SUTURE) ×1
SUT VIC AB 2-0 CT1 TAPERPNT 27 (SUTURE) ×1 IMPLANT
SUT VIC AB 3-0 CT1 27 (SUTURE)
SUT VIC AB 3-0 CT1 TAPERPNT 27 (SUTURE) IMPLANT
SUT VIC AB 4-0 KS 27 (SUTURE) ×2 IMPLANT
TOWEL OR 17X24 6PK STRL BLUE (TOWEL DISPOSABLE) ×2 IMPLANT
TRAY FOLEY W/BAG SLVR 14FR LF (SET/KITS/TRAYS/PACK) ×2 IMPLANT
WATER STERILE IRR 1000ML POUR (IV SOLUTION) ×2 IMPLANT

## 2021-09-16 NOTE — Progress Notes (Signed)
Patient ID: Lynn Gallegos, female   DOB: June 30, 1985, 36 y.o.   MRN: [id number] ?A2GDM, CHTN with superimposed preeclampsia at 37 3/7 weeks ? ?Pt feeling mild contractions, no HA or PIH sx ? ?Afeb BP all normal to mildly elevated on magnesium ?FHR Category 1 ? ?40/cl/high ? ?Labs this AM with stable platelets 211 ?AST/ALT 71/78 (prior 54/57)  ?Glucose 93 this AM ? ?D/w pt cervix is still closed and I cannot push through to place foley bulb.  We reviewed that we are somewhat limited with what we can do to try to get her in labor as a VBAC ?She has been up to 20 mu of pitocin for some hours so we will stop and rest uterus for 20 minutes then restart at 73mu ?I d/w pt as long as she and baby are doing well we can continue for TOLAC but that if her labs continue to get more abnormal and no progress made in next 4-6 hours with placing foley bulb, I recommend we proceed with repeat c-section.  She is agreeable to this plan.  Will reassess for foley bulb in next 4-6 hours ? ? ? ? ? ? ? ? ?

## 2021-09-16 NOTE — Op Note (Signed)
Operative Note ? ? ? ?Preoperative Diagnosis ?Term pregnancy at 37 3/7 weeks ?CHTN with superimposed preeclampsia ?Failed trial of labor after c-section ?Prior c-section x 1  ? ?Postoperative Diagnosis ?Same with moderate uterine atony ? ?Procedure ?Repeat low transverse c-section with two layer closure of uterus ? ?Surgeon ?Huel Cote, MD ? ?Anesthesia ?Spinal ? ?Fluids: ?EBL ?UOP clear ?IVF ? ?Findings ?There was a viable female infant in the vertex presentation.  Apgars 8,9 Weight pending.  Uterus had moderate atony that responded to bimanual massage and pitocin.  Pt was pretreated with TXA prior to surgery.  ? ?Specimen ?Placenta --pt plans to keep ? ?Procedure Note  ?Patient was taken to the operating room where spinal anesthesia was obtained and found to be adequate by Allis clamp test. She was prepped and draped in the normal sterile fashion in the dorsal supine position with a leftward tilt. An appropriate time out was performed. A Pfannenstiel skin incision was then made through a pre-existing scar with the scalpel and carried through to the underlying layer of fascia by sharp dissection and Bovie cautery. The fascia was nicked in the midline and the incision was extended laterally with Mayo scissors. The inferior aspect of the incision was grasped Coker clamps and dissected off the underlying rectus muscles. In a similar fashion the superior aspect was dissected off the rectus muscles. Rectus muscles were separated in the midline and the peritoneal cavity entered bluntly. The peritoneal incision was then extended both superiorly and inferiorly with careful attention to avoid both bowel and bladder. The Alexis self-retaining wound retractor was then placed within the incision and the lower uterine segment exposed. The bladder flap was developed with Metzenbaum scissors and pushed away from the lower uterine segment. The lower uterine segment was then incised in a transverse fashion  and the cavity itself entered bluntly. The incision was extended bluntly. The infant's head was then lifted and could not quite be delivered from the incision with fundal pressure so the kiwi vacuum was applied in the green zone and in one gentle pull was able to be delivered.  There were no pop-offs.   The remainder of the infant delivered and the nose and mouth bulb suctioned with the cord clamped and cut as well. The infant was handed off to the waiting pediatricians. The placenta was then spontaneously expressed from the uterus and the uterus cleared of all clots and debris with moist lap sponge. There was moderate atony which responded to bimanual massage and pitocin.The uterine incision was then repaired in 2 layers the first layer was a running locked layer of 1-0 chromic and the second an imbricating layer of the same suture. The tubes and ovaries were inspected and the gutters cleared of all clots and debris. The uterine incision was inspected and found to be hemostatic. All instruments and sponges as well as the Alexis retractor were then removed from the abdomen. The rectus muscles and peritoneum were then reapproximated with a running suture of 2-0 Vicryl. The fascia was then closed with 0 Vicryl in a running fashion. Subcutaneous tissue was reapproximated with 3-0 plain in a running fashion. The skin was closed with a subcuticular stitch of 4-0 Vicryl on a Keith needle and then reinforced with benzoin and Steri-Strips. At the conclusion of the procedure all instruments and sponge counts were correct. Patient was taken to the recovery room in good condition with her baby accompanying her skin to skin.   ? ?D/w parents circumcision and  they decline.   ?Will continue magnesium x 24 hours, continue procardia XL 60mg  po q AM and 30mg  po qPM. ? ? ?

## 2021-09-16 NOTE — Anesthesia Preprocedure Evaluation (Addendum)
Anesthesia Evaluation  ?Patient identified by MRN, date of birth, ID band ?Patient awake ? ? ? ?Reviewed: ?Allergy & Precautions, NPO status , Patient's Chart, lab work & pertinent test results ? ?History of Anesthesia Complications ?Negative for: history of anesthetic complications ? ?Airway ?Mallampati: II ? ?TM Distance: >3 FB ?Neck ROM: Full ? ? ? Dental ?no notable dental hx. ? ?  ?Pulmonary ?neg pulmonary ROS,  ?  ?Pulmonary exam normal ? ? ? ? ? ? ? Cardiovascular ?hypertension, Pt. on medications ?Normal cardiovascular exam ? ? ?  ?Neuro/Psych ?negative neurological ROS ? negative psych ROS  ? GI/Hepatic ?negative GI ROS, Transaminitis (ALT 78, AST 71) ?  ?Endo/Other  ?diabetes, Gestational, Oral Hypoglycemic AgentsMorbid obesity ? Renal/GU ?negative Renal ROS  ?negative genitourinary ?  ?Musculoskeletal ?negative musculoskeletal ROS ?(+)  ? Abdominal ?  ?Peds ? Hematology ? ?(+) Blood dyscrasia (Hgb 11.1, Plt 215k), anemia ,   ?Anesthesia Other Findings ?Day of surgery medications reviewed with patient. ? Reproductive/Obstetrics ?(+) Pregnancy (preE on Mg) ? ?  ? ? ? ? ? ? ? ? ? ? ? ? ? ?  ?  ? ? ? ? ? ? ? ?Anesthesia Physical ?Anesthesia Plan ? ?ASA: 3 and emergent ? ?Anesthesia Plan: Spinal  ? ?Post-op Pain Management:   ? ?Induction:  ? ?PONV Risk Score and Plan: 4 or greater and Treatment may vary due to age or medical condition, Ondansetron and Dexamethasone ? ?Airway Management Planned: Natural Airway ? ?Additional Equipment: None ? ?Intra-op Plan:  ? ?Post-operative Plan:  ? ?Informed Consent: I have reviewed the patients History and Physical, chart, labs and discussed the procedure including the risks, benefits and alternatives for the proposed anesthesia with the patient or authorized representative who has indicated his/her understanding and acceptance.  ? ? ? ? ? ?Plan Discussed with: CRNA ? ?Anesthesia Plan Comments:   ? ? ? ? ? ?Anesthesia Quick Evaluation ? ?

## 2021-09-16 NOTE — Anesthesia Procedure Notes (Signed)
Spinal ? ?Patient location during procedure: OR ?Start time: 09/16/2021 4:12 PM ?End time: 09/16/2021 4:15 PM ?Reason for block: surgical anesthesia ?Staffing ?Performed: anesthesiologist  ?Anesthesiologist: Kaylyn Layer, MD ?Preanesthetic Checklist ?Completed: patient identified, IV checked, risks and benefits discussed, monitors and equipment checked, pre-op evaluation and timeout performed ?Spinal Block ?Patient position: sitting ?Prep: DuraPrep and site prepped and draped ?Patient monitoring: heart rate, continuous pulse ox and blood pressure ?Approach: midline ?Location: L3-4 ?Injection technique: single-shot ?Needle ?Needle type: Pencan  ?Needle gauge: 24 G ?Needle length: 10 cm ?Assessment ?Sensory level: T4 ?Events: CSF return ?Additional Notes ?Risks, benefits, and alternative discussed. Patient gave consent to procedure. Prepped and draped in sitting position. Clear CSF obtained after one needle pass. Positive terminal aspiration. No pain or paraesthesias with injection. Patient tolerated procedure well. Vital signs stable. Amalia Greenhouse, MD ? ? ? ? ?

## 2021-09-16 NOTE — Transfer of Care (Signed)
Immediate Anesthesia Transfer of Care Note ? ?Patient: Lynn Gallegos ? ?Procedure(s) Performed: CESAREAN SECTION ? ?Patient Location: PACU ? ?Anesthesia Type:Spinal ? ?Level of Consciousness: awake, alert  and oriented ? ?Airway & Oxygen Therapy: Patient Spontanous Breathing ? ?Post-op Assessment: Report given to RN and Post -op Vital signs reviewed and stable ? ?Post vital signs: Reviewed and stable ? ?Last Vitals:  ?Vitals Value Taken Time  ?BP 122/70 09/16/21 1745  ?Temp    ?Pulse 73 09/16/21 1752  ?Resp 17 09/16/21 1752  ?SpO2 99 % 09/16/21 1752  ?Vitals shown include unvalidated device data. ? ?Last Pain:  ?Vitals:  ? 09/16/21 1500  ?TempSrc:   ?PainSc: 0-No pain  ?   ? ?  ? ?Complications: No notable events documented. ?

## 2021-09-16 NOTE — Anesthesia Postprocedure Evaluation (Signed)
Anesthesia Post Note ? ?Patient: Lynn Gallegos ? ?Procedure(s) Performed: CESAREAN SECTION ? ?  ? ?Patient location during evaluation: PACU ?Anesthesia Type: Spinal ?Level of consciousness: oriented and awake and alert ?Pain management: pain level controlled ?Vital Signs Assessment: post-procedure vital signs reviewed and stable ?Respiratory status: spontaneous breathing, respiratory function stable and patient connected to nasal cannula oxygen ?Cardiovascular status: blood pressure returned to baseline and stable ?Postop Assessment: no headache, no backache and no apparent nausea or vomiting ?Anesthetic complications: no ? ? ?No notable events documented. ? ?Last Vitals:  ?Vitals:  ? 09/16/21 1830 09/16/21 1842  ?BP:  97/78  ?Pulse: 82   ?Resp: 13 20  ?Temp:    ?SpO2: 95%   ?  ?Last Pain:  ?Vitals:  ? 09/16/21 1830  ?TempSrc:   ?PainSc: 2   ? ?Pain Goal:   ? ?LLE Motor Response: Responds to commands (09/16/21 1830) ?LLE Sensation: Tingling (09/16/21 1830) ?RLE Motor Response: Responds to commands (09/16/21 1830) ?RLE Sensation: Tingling (09/16/21 1830) ?L Sensory Level: L1-Inguinal (groin) region (09/16/21 1830) ?R Sensory Level: L1-Inguinal (groin) region (09/16/21 1830) ?Epidural/Spinal Function Cutaneous sensation: Able to Wiggle Toes (09/16/21 1830), Patient able to flex knees: Yes (09/16/21 1830), Patient able to lift hips off bed: No (09/16/21 1830), Back pain beyond tenderness at insertion site: No (09/16/21 1830), Progressively worsening motor and/or sensory loss: No (09/16/21 1830), Bowel and/or bladder incontinence post epidural: No (09/16/21 1830) ? ?Lynn Gallegos ? ? ? ? ?

## 2021-09-16 NOTE — Progress Notes (Signed)
Patient ID: Lynn Gallegos, female   DOB: [birth year], 37 y.o.   MRN: [id number] ? ? ?Pt has been on pitocin since yesterday and felt no more than mild contractions despite being taken up to 20 mu.   ?On exam, I cannot push through her cervix to place a foley balloon.  D/w pt since she is a VBAC we do not have any other options to induce labor.  Baby is fine and category 1.  Her BP is stable on magnesium.  Labs this AM were stable but LFT's creeping up a bit.   ? ?Pt feels ready to stop induction attempt and proceed with c-section.  She would like to wait until later this afternoon when her partner can get here to be with her which is reasonable since she is stable. ? ?Will d/c pitocin and OR notified with c-section posted for 4pm.   ? ?Pt counseled about c-section risks and benefits in detail including bleeding, infection and possible damage to bowel and bladder.  We reviewed the procedure step by step and that the nursery would assess baby at delivery since 37 weeks.  We will repeat labs now and proceed at 4-430pm as long as all stable. ?

## 2021-09-16 NOTE — Lactation Note (Signed)
This note was copied from a baby's chart. ?Lactation Consultation Note ? ?Patient Name: Lynn Gallegos ?Today's Date: 09/16/2021 ?Reason for consult: Initial assessment;Early term 37-38.6wks;Maternal endocrine disorder ?Age:34 hours ?Per mom, if she has supplement her choice is donor breast milk. ?Mom would like to latch infant tonight and give infant any EBM that she pumped or hand express, if infant doesn't latch. ?Per mom,she feels breastfeeding is going well,  infant breastfeed well in recovery for 15 minutes. ?Mom is experienced at breast feeding see maternal data below. ?Infant asleep in basinet  when [facility] entered the room, and mom knows to call RN/LC for latch assistance if needed. ?LC set mom up with DEBP, mom was pumping as LC left the room. ?Mom made aware of O/P services, breastfeeding support groups, community resources, and our phone # for post-discharge questions.   ?Mom's current feeding plan: ?1- Mom will continue to BF infant on demand, by cues, 8 to 12+ or more times within 24 hours, skin to skin. ?2-Mom will ask RN/LC for latch assistance if needed. ?3- Mom will continue to use DEBP and give infant back any EBM from finger feeding or using a spoon.  ?Maternal Data ?Has patient been taught Hand Expression?: Yes ?Does the patient have breastfeeding experience prior to this delivery?: Yes ?How long did the patient breastfeed?: Per mom, she BF her 1st child for five months. ? ?Feeding ?Mother's Current Feeding Choice: Breast Milk and Donor Milk ? ?LATCH Score ?  ? ?  ? ?  ? ?  ? ?  ? ?  ? ? ?Lactation Tools Discussed/Used ?Breast pump type: Double-Electric Breast Pump ?Pump Education: Setup, frequency, and cleaning;Milk Storage ?Reason for Pumping: Mom with C/S delivery, ETI, mokm with hx of DM2 and CHTN in pregnancy. ?Pumping frequency: Mom knows to pump every 3 hours for 15 minutes on inital setting. ? ?Interventions ?Interventions: Breast feeding basics reviewed;Skin to skin;Hand express;Hand  pump;DEBP;Education;LC Services brochure ? ?Discharge ?  ? ?Consult Status ?Consult Status: Follow-up ?Date: 09/17/21 ?Follow-up type: In-patient ? ? ? ?Danelle Earthly ?09/16/2021, 9:03 PM ? ? ? ?

## 2021-09-17 ENCOUNTER — Encounter (HOSPITAL_COMMUNITY): Payer: Self-pay | Admitting: Obstetrics and Gynecology

## 2021-09-17 LAB — COMPREHENSIVE METABOLIC PANEL
ALT: 76 U/L — ABNORMAL HIGH (ref 0–44)
AST: 73 U/L — ABNORMAL HIGH (ref 15–41)
Albumin: 2.7 g/dL — ABNORMAL LOW (ref 3.5–5.0)
Alkaline Phosphatase: 89 U/L (ref 38–126)
Anion gap: 6 (ref 5–15)
BUN: <5 mg/dL — ABNORMAL LOW (ref 6–20)
CO2: 25 mmol/L (ref 22–32)
Calcium: 7.5 mg/dL — ABNORMAL LOW (ref 8.9–10.3)
Chloride: 104 mmol/L (ref 98–111)
Creatinine, Ser: 0.66 mg/dL (ref 0.44–1.00)
GFR, Estimated: >60 mL/min (ref >60)
Glucose, Bld: 139 mg/dL — ABNORMAL HIGH (ref 70–99)
Potassium: 4.2 mmol/L (ref 3.5–5.1)
Sodium: 135 mmol/L (ref 135–145)
Total Bilirubin: 0.4 mg/dL (ref 0.3–1.2)
Total Protein: 6.2 g/dL — ABNORMAL LOW (ref 6.5–8.1)

## 2021-09-17 LAB — CBC
HCT: 31.3 % — ABNORMAL LOW (ref 36.0–46.0)
Hemoglobin: 10.7 g/dL — ABNORMAL LOW (ref 12.0–15.0)
MCH: 28.2 pg (ref 26.0–34.0)
MCHC: 34.2 g/dL (ref 30.0–36.0)
MCV: 82.4 fL (ref 80.0–100.0)
Platelets: 205 10*3/uL (ref 150–400)
RBC: 3.8 MIL/uL — ABNORMAL LOW (ref 3.87–5.11)
RDW: 13.2 % (ref 11.5–15.5)
WBC: 12.5 10*3/uL — ABNORMAL HIGH (ref 4.0–10.5)
nRBC: 0 % (ref 0.0–0.2)

## 2021-09-17 LAB — MAGNESIUM: Magnesium: 4.6 mg/dL — ABNORMAL HIGH (ref 1.7–2.4)

## 2021-09-17 MED ORDER — FUROSEMIDE 10 MG/ML IJ SOLN
20.0000 mg | Freq: Once | INTRAMUSCULAR | Status: AC
  Administered 2021-09-17: 20 mg via INTRAVENOUS
  Filled 2021-09-17: qty 2

## 2021-09-17 NOTE — Lactation Note (Signed)
This note was copied from a baby's chart. ? ?NICU Lactation Consultation Note ? ?Patient Name: Lynn Gallegos ?Today's Date: 09/17/2021 ?Age:34 hours ? ? ?Subjective ?Reason for consult: Follow-up assessment ? ?Lactation followed up with Lynn Gallegos and 74 hour old son, Lynn Gallegos. She reports that feedings were slow last night until baby had large emesis and meconium diaper. After that, he began to cue more vigorously and feed more consistently. She reports that he just fed prior to entry for 30+ minutes. She denies breast pain with latch.  ? ?Baby's output and weight loss are WNL by hour of life. Lynn Gallegos has a DEBP set up and was using a manual pump upon entry. Droplets noted in the flange. I reviewed HE, and Lynn Gallegos was able to demonstrate expression. ? ?I noted formula in the room. Lynn Gallegos states that RN overnight expressed concern over baby's feeding frequency and brought in ABM. Parents have not given baby formula, and expressed discomfort with using it. I provided additional spoons and discouraged use of artificial nipples until breastfeeding is well established (unless medically indicated). Lynn Gallegos verbalized that she was up to date on these recommendations and did not want to give baby an artificial nipple unless medically indicated. She expressed concern about baby having dependence on a bottle nipple if provided too early on. ? ?I provided education on day 2 infant feeding patterns and offered to assist with breastfeeding as needed. ? ?I followed up with the RN following the consult and observed her place an order for [facility] to be used, PRN today and tonight. ? ?I encouraged Lynn Gallegos to call, as needed for assistance.  ? ?Objective ?Infant data: ?Mother's Current Feeding Choice: Breast Milk ? ?Infant feeding assessment ?  ?Maternal data: ?[id number]  ?C-Section, Vacuum Assisted ?Previous breastfeeding challenges?: Lack of support; Other (Comment) (difficulty maintaining milk volume) ? ?Does the  patient have breastfeeding experience prior to this delivery?: Yes ?How long did the patient breastfeed?: 3+ months ? ?Pumping frequency: PRN ?Pumped volume: 0 mL ? ?  ?Assessment ?Infant: ?LATCH Score: 8 ? ?Intervention/Plan ?Interventions: Breast feeding basics reviewed; Hand pump; Education ? ?Tools: Pump ?Pump Education: Setup, frequency, and cleaning ? ?Plan: ?Consult Status: Follow-up ? ? ?Walker Shadow ?09/17/2021, 11:27 AM ?

## 2021-09-17 NOTE — Progress Notes (Signed)
Patient ID: Lynn Gallegos, female   DOB: Jan 23, 1986, 36 y.o.   MRN: [id number] ?RN called and reported pt was a bit somnolent and UOP diminished.   Magnesium held and level checked and 4.6 ? ?Pt will be restarted on magnesium at 1gram/hour and given dose of lasix ?BP stable, no severe levels.  Labs essentially unchanged.  LFT's slightly elevated in 70's Platelets 205 and creatinine 0.66. ? ? ?

## 2021-09-17 NOTE — Progress Notes (Signed)
Subjective: ?Postpartum Day 1: Cesarean Delivery ?Doing well this morning. Feels better on reduced dose of magnesium. Foley in place. Not yet ambulating. Pain controlled. Lochia minimal. No HA/VC/RUQ pain ? ?Objective: ?Patient Vitals for the past 24 hrs: ? BP Temp Temp src Pulse Resp SpO2  ?09/17/21 0900 -- -- -- -- 17 --  ?09/17/21 0758 129/69 98.1 ?F (36.7 ?C) Oral 72 18 98 %  ?09/17/21 0600 -- -- -- -- 18 --  ?09/17/21 0425 136/68 98.1 ?F (36.7 ?C) Oral 75 17 98 %  ?09/17/21 0300 -- -- -- -- 17 --  ?09/16/21 2357 129/76 97.7 ?F (36.5 ?C) Oral 69 15 96 %  ?09/16/21 2300 -- -- -- -- 16 --  ?09/16/21 2100 -- -- -- -- 15 --  ?09/16/21 2017 129/66 98.1 ?F (36.7 ?C) Oral 78 -- 95 %  ?09/16/21 1958 129/66 97.6 ?F (36.4 ?C) Oral 75 18 96 %  ?09/16/21 1900 121/65 97.7 ?F (36.5 ?C) Oral 75 18 --  ?09/16/21 1842 97/78 -- -- -- 20 --  ?09/16/21 1830 -- -- -- 82 13 95 %  ?09/16/21 1815 116/75 -- -- 71 11 95 %  ?09/16/21 1800 115/65 -- -- 70 19 97 %  ?09/16/21 1745 122/70 (!) 97.5 ?F (36.4 ?C) -- 78 13 --  ?09/16/21 1535 136/61 -- -- 93 -- --  ?09/16/21 1500 126/79 -- -- 87 18 --  ?09/16/21 1430 132/80 -- -- 88 -- --  ?09/16/21 1411 131/75 -- -- 86 17 --  ?09/16/21 1300 136/68 -- -- 85 18 --  ?09/16/21 1230 140/85 -- -- 84 17 --  ?09/16/21 1200 130/74 98 ?F (36.7 ?C) Oral 83 18 --  ?09/16/21 1131 (!) 142/81 -- -- 86 -- --  ?09/16/21 1100 132/84 -- -- 89 18 --  ? ?UOP > 100cc/hr ? ?Physical Exam:  ?General: alert, cooperative, and no distress ?Lochia: appropriate ?Uterine Fundus: firm ?Incision: healing well, no significant drainage, no dehiscence, no significant erythema ?DVT Evaluation: No evidence of DVT seen on physical exam. ? ?Recent Labs  ?  09/16/21 ?1403 09/17/21 ?0108  ?HGB 11.1* 10.7*  ?HCT 33.9* 31.3*  ? ? ?Assessment/Plan: ? ?Lynn Gallegos [id number] POD#1 sp repeat cesarean at [redacted]w[redacted]d ?1. PPC: routine PP care ?- d/c foley when able to ambulate ?2. CHTN with superimposed severe preeclampsia: continue magnesium until 24  hours after delivery (1644 today). Blood pressures currently well controlled on ProcardiaXL 60mg  in AM and 30mg  in PM (pregnancy dose). Will monitor blood pressures after stopping magnesium.  ?3. Dispo: continue inpatient mgmt ? ? ?09/17/2021, 10:59 AM  ? ?

## 2021-09-18 MED ORDER — LABETALOL HCL 200 MG PO TABS: 400.0000 mg | ORAL_TABLET | Freq: Two times a day (BID) | ORAL | Status: DC

## 2021-09-18 MED ORDER — LABETALOL HCL 200 MG PO TABS
400.0000 mg | ORAL_TABLET | Freq: Three times a day (TID) | ORAL | Status: DC
Start: 2021-09-18 — End: 2021-09-19
  Administered 2021-09-18 – 2021-09-19 (×2): 400 mg via ORAL
  Filled 2021-09-18 (×2): qty 2

## 2021-09-18 MED ORDER — LABETALOL HCL 200 MG PO TABS
300.0000 mg | ORAL_TABLET | Freq: Two times a day (BID) | ORAL | Status: DC
  Administered 2021-09-18: 300 mg via ORAL
  Filled 2021-09-18: qty 1

## 2021-09-18 NOTE — Lactation Note (Signed)
This note was copied from a baby's chart. ?Lactation Consultation Note ? ?Patient Name: Lynn Gallegos ?Today's Date: 09/18/2021 ?Reason for consult: Follow-up assessment;Mother's request;Early term 37-38.6wks;Maternal endocrine disorder;Breastfeeding assistance (CHTN ( Nifedipine, labetalol )) ?Age:34 hours ? ?Infant adequate urine and stool output. Mom stated start latch painful until she corrects it. Mom used DBM x1 overnight, but since then feedings picked up.  ?Plan 1.To feed based on cues 8-12x 24hr period. Mom to offer breasts and look for signs of milk transfer.  ?2. Mom to supplement with EBM or DBM 7-12 ml per feeding  ?3. DEBP q 3hrs for 15 min  ? ?All questions answered at the end of the visit.  ? ?Maternal Data ?Has patient been taught Hand Expression?: Yes ? ?Feeding ?Mother's Current Feeding Choice: Breast Milk ? ?LATCH Score ?  ? ?  ? ?  ? ?  ? ?  ? ?  ? ? ?Lactation Tools Discussed/Used ?Tools: Pump;Flanges;Coconut oil ?Flange Size: 24 ?Breast pump type: Double-Electric Breast Pump ?Pump Education: Setup, frequency, and cleaning;Milk Storage ?Reason for Pumping: increase stimulation ?Pumping frequency: every 3 hrs for 15 min ? ?Interventions ?Interventions: Breast feeding basics reviewed;Hand express;Expressed milk;DEBP;Pace feeding;Theme park manager brochure ? ?Discharge ?Pump: DEBP;Personal ?WIC Program: No ? ?Consult Status ?Consult Status: Follow-up ?Date: 09/19/21 ?Follow-up type: In-patient ? ? ? ?Lynn Shands  Gallegos ?09/18/2021, 11:37 AM ? ? ? ?

## 2021-09-18 NOTE — Progress Notes (Signed)
Subjective: ?Postpartum Day 2: Cesarean Delivery ?Doing well this morning. Feels better on reduced dose of magnesium. Foley in place. Not yet ambulating. Pain controlled. Lochia minimal. No HA/VC/RUQ pain ? ?Objective: ?Patient Vitals for the past 24 hrs: ? BP Temp Temp src Pulse Resp SpO2  ?09/18/21 0810 (!) 156/94 98.5 ?F (36.9 ?C) Oral 86 17 98 %  ?09/18/21 0332 (!) 143/78 98.9 ?F (37.2 ?C) Oral 81 18 99 %  ?09/17/21 2257 (!) 150/80 98.7 ?F (37.1 ?C) Oral 88 17 98 %  ?09/17/21 1951 140/84 98.4 ?F (36.9 ?C) Oral 87 17 97 %  ?09/17/21 1520 -- -- -- -- 18 --  ?09/17/21 1510 -- 98.4 ?F (36.9 ?C) Oral -- -- --  ?09/17/21 1400 -- -- -- -- 18 --  ?09/17/21 1252 114/72 98.4 ?F (36.9 ?C) Oral 79 18 96 %  ?09/17/21 1130 -- -- -- -- 17 --  ?09/17/21 1020 -- -- -- -- 17 --  ? ?UOP > 100cc/hr ? ?Physical Exam:  ?General: alert, cooperative, and no distress ?Lochia: appropriate ?Uterine Fundus: firm ?Incision: healing well, no significant drainage, no dehiscence, no significant erythema ?DVT Evaluation: No evidence of DVT seen on physical exam. ? ?Recent Labs  ?  09/16/21 ?1403 09/17/21 ?0108  ?HGB 11.1* 10.7*  ?HCT 33.9* 31.3*  ? ? ?Assessment/Plan: ? ?Lynn Gallegos [id number] POD#2 sp repeat cesarean at [redacted]w[redacted]d ?1. PPC: routine PP care ?- d/c foley when able to ambulate ?2. CHTN with superimposed severe preeclampsia: continue magnesium until 24 hours after delivery (1644 today). Magnesium sulfate discontinued yesterday afternoon.  BPs were well controlled while on MgSO4, but have been increasing since discontinuation.  Currently on ProcardiaXL 60mg  in AM and 30mg  in PM (pregnancy dose).  Will add labetalol 300mg  PO BID and consider increasing PM dose of procardia tonight ?3. Dispo: continue inpatient mgmt ? ?[place] ?09/18/2021, 9:43 AM  ? ?

## 2021-09-18 NOTE — Progress Notes (Signed)
Feeling well ?BPs still elevated despite addition of labetalol 300mg  ? ?Patient examined again--does not appear to be fluid overloaded--no LE edema.  Do not feel that adding lasix will be helpful at this time.  Will increase labetalol to 400mg  q8h ?

## 2021-09-19 LAB — COMPREHENSIVE METABOLIC PANEL
ALT: 41 U/L (ref 0–44)
AST: 28 U/L (ref 15–41)
Albumin: 2.6 g/dL — ABNORMAL LOW (ref 3.5–5.0)
Alkaline Phosphatase: 73 U/L (ref 38–126)
Anion gap: 8 (ref 5–15)
BUN: 5 mg/dL — ABNORMAL LOW (ref 6–20)
CO2: 22 mmol/L (ref 22–32)
Calcium: 8.7 mg/dL — ABNORMAL LOW (ref 8.9–10.3)
Chloride: 108 mmol/L (ref 98–111)
Creatinine, Ser: 0.53 mg/dL (ref 0.44–1.00)
GFR, Estimated: >60 mL/min (ref >60)
Glucose, Bld: 99 mg/dL (ref 70–99)
Potassium: 3.5 mmol/L (ref 3.5–5.1)
Sodium: 138 mmol/L (ref 135–145)
Total Bilirubin: 0.5 mg/dL (ref 0.3–1.2)
Total Protein: 5.8 g/dL — ABNORMAL LOW (ref 6.5–8.1)

## 2021-09-19 LAB — CBC
HCT: 29.2 % — ABNORMAL LOW (ref 36.0–46.0)
Hemoglobin: 9.4 g/dL — ABNORMAL LOW (ref 12.0–15.0)
MCH: 27.4 pg (ref 26.0–34.0)
MCHC: 32.2 g/dL (ref 30.0–36.0)
MCV: 85.1 fL (ref 80.0–100.0)
Platelets: 191 10*3/uL (ref 150–400)
RBC: 3.43 MIL/uL — ABNORMAL LOW (ref 3.87–5.11)
RDW: 13.8 % (ref 11.5–15.5)
WBC: 9 10*3/uL (ref 4.0–10.5)
nRBC: 0 % (ref 0.0–0.2)

## 2021-09-19 MED ORDER — LABETALOL HCL 200 MG PO TABS
600.0000 mg | ORAL_TABLET | Freq: Three times a day (TID) | ORAL | Status: DC
Start: 2021-09-19 — End: 2021-09-20
  Administered 2021-09-19 – 2021-09-20 (×3): 600 mg via ORAL
  Filled 2021-09-19 (×3): qty 3

## 2021-09-19 NOTE — Progress Notes (Signed)
Patient is doing well.  She is tolerating PO, ambulating, voiding.  Pain is controlled.  Lochia is appropriate ? ?Vitals:  ? 09/18/21 2355 09/19/21 0437 09/19/21 0630 09/19/21 0917  ?BP: (!) 143/79 (!) 156/78  (!) 150/79  ?Pulse: 81 76 78 91  ?Resp: 18 16  17   ?Temp: 98.3 ?F (36.8 ?C) 98 ?F (36.7 ?C)  97.9 ?F (36.6 ?C)  ?TempSrc: Oral     ?SpO2: 96% 97%  99%  ?Weight:      ?Height:      ? ? ?NAD ?Abdomen:  soft, appropriate tenderness, incisions intact and without erythema or drainage ?ext:    Symmetric, no edema bilaterally ? ?Lab Results  ?Component Value Date  ? WBC 9.0 09/19/2021  ? HGB 9.4 (L) 09/19/2021  ? HCT 29.2 (L) 09/19/2021  ? MCV 85.1 09/19/2021  ? PLT 191 09/19/2021  ? ? ?--/--/O POS (05/03 1432) ? ?A/P    36 y.o. G2P2002 POD 3 s/p RCS  ?CHTN with superimposed severe preeclampsia: Baseline BPs in early pregnancy were high 130-140s/80-90s.  On her pregnancy dose of procardia XL (60mg  in AM and 30mg  qhs).  Labetalol added yesterday---will increase to 600mg  TID.  Was on amlodipine prior to pregnancy but reports this did not adequately control her BPs.  Per RN, baby likely staying today due to weight loss, but if baby is able to be discharged and BPs improve to her baseline of140s/80-90s, would consider discharge to home with close follow up in the office ? ? ? ?

## 2021-09-19 NOTE — Lactation Note (Signed)
This note was copied from a baby's chart. ?Lactation Consultation Note ? ?Patient Name: Lynn Gallegos ?Today's Date: 09/19/2021 ?  ?Age:34 hours ? ?LC visit attempted, but OB entered room. Mom did share that infant recently took 19 ml of EBM that Mom had pumped.  ? ?K. Caudle, RN shared that she had advised Mom to put infant to the breast, followed by EBM/DBM, and then pumping.  ? ?LC to f/u again later. ?Lynn Gallegos ?09/19/2021, 10:21 AM ? ? ? ?

## 2021-09-19 NOTE — Lactation Note (Addendum)
This note was copied from a baby's chart. ?Lactation Consultation Note ? ?Patient Name: Lynn Gallegos ?Today's Date: 09/19/2021 ?Reason for consult: Follow-up assessment;Mother's request;Early term 37-38.6wks;Breastfeeding assistance;Infant weight loss ?Age:34 hours ? ?Infant gained 49 grams today. Mom offering breast first followed by pumped breast milk.  ?Mom denied any pain with pumping using 24 flange. Mom encouraged to pump on maintenance setting since volume past 20 ml 3 x in a row.  ? ?Mom pumping on LC arrival. LC encouraged Mom to call for latch assistance if needed for next feeding.  ? ?Plan 1. To feed based on cues 8-12x 24hr period. Mom to offer breasts and look for signs of milk transfer.  ?2. Mom to supplement with EBM with pace bottle feeding and slow flow nipple. BF supplementation guide provided. Mom aware if not latching for a feeding to offer more.  ?3. DEBP q 3hrs for 15 - 20 min.  ? ?Maternal Data ?  ? ?Feeding ?Mother's Current Feeding Choice: Breast Milk ?Nipple Type: Slow - flow ? ?LATCH Score ?  ? ?  ? ?  ? ?  ? ?  ? ?  ? ? ?Lactation Tools Discussed/Used ?Tools: Pump;Flanges ?Flange Size: 24 ?Breast pump type: Double-Electric Breast Pump ?Pump Education: Setup, frequency, and cleaning;Milk Storage ?Reason for Pumping: increase stimulation ?Pumping frequency: every 3 hrs for 15 min ? ?Interventions ?Interventions: Breast feeding basics reviewed;Hand express;Breast compression;Expressed milk;DEBP;Infant Driven Feeding Algorithm education;LC Services brochure ? ?Discharge ?Pump: DEBP ? ?Consult Status ?Consult Status: Follow-up ?Date: 09/20/21 ?Follow-up type: In-patient ? ? ? ?Chayah Mckee  Nicholson-Springer ?09/19/2021, 8:21 PM ? ? ? ?

## 2021-09-20 MED ORDER — OXYCODONE HCL 5 MG PO TABS: 5.0000 mg | ORAL_TABLET | ORAL | 0 refills | Status: DC | PRN

## 2021-09-20 MED ORDER — LABETALOL HCL 300 MG PO TABS: 600.0000 mg | ORAL_TABLET | Freq: Three times a day (TID) | ORAL | 1 refills | Status: DC

## 2021-09-20 NOTE — Progress Notes (Signed)
POD #4 LTCS ?Doing ok, ready to go home ?Afeb, VSS, BP 120-130/60-70 ?Abd- soft, fundus firm, incision intact ?D/c home ?

## 2021-09-20 NOTE — Discharge Summary (Signed)
? ?  Postpartum Discharge Summary ? ?   ?Patient Name: Lynn Gallegos ?DOB: Sep 28, 1985 ?MRN: [id number] ? ?Date of admission: 09/15/2021 ?Delivery date:09/16/2021  ?Delivering provider: Huel Cote  ?Date of discharge: 09/20/2021 ? ?Admitting diagnosis: Chronic hypertension with superimposed preeclampsia [O11.9] ?S/P repeat low transverse C-section [B71.696] ?Intrauterine pregnancy: [redacted]w[redacted]d     ?Secondary diagnosis:  Principal Problem: ?  Chronic hypertension with superimposed preeclampsia ?Active Problems: ?  S/P repeat low transverse C-section ? ?    ?Discharge diagnosis: Term Pregnancy Delivered, CHTN with superimposed preeclampsia, and GDM A2                                              ? ?Hospital course: Induction of Labor With Cesarean Section   ?36 y.o. yo G2P2002 at [redacted]w[redacted]d was admitted to the hospital 09/15/2021 for induction of labor. Patient had a labor course significant for failed induction-minimal response to pitocin, cervix closed. The patient went for cesarean section due to Elective Repeat. Delivery details are as follows: ?Membrane Rupture Time/Date: 4:43 PM ,09/16/2021   ?Delivery Method:C-Section, Vacuum Assisted  ?Details of operation can be found in separate operative Note.  Patient had a postpartum course complicated by persistent HTN.  She received magnesium for 24 hrs, continued on her preadmission procardia, required addition of labetalol 600 mg tid to control BP. She is ambulating, tolerating a regular diet, passing flatus, and urinating well.  Patient is discharged home in stable condition on 09/20/21.     ? ?Newborn Data: ?Birth date:09/16/2021  ?Birth time:4:44 PM  ?Gender:Female  ?Living status:Living  ?Apgars:8 ,9  ?Weight:2830 g                               ? ?Magnesium Sulfate received: Yes: Seizure prophylaxis ? ?Physical exam  ?Vitals:  ? 09/19/21 2249 09/20/21 7893 09/20/21 8101 09/20/21 0824  ?BP: (!) 130/49 129/68  135/71  ?Pulse: 79 77 82 78  ?Resp: 18 18  18   ?Temp: 97.7 ?F (36.5 ?C) 97.8  ?F (36.6 ?C)  98.2 ?F (36.8 ?C)  ?TempSrc: Oral Oral  Oral  ?SpO2: 100% 99%  100%  ?Weight:      ?Height:      ? ?General: alert ?Lochia: appropriate ?Uterine Fundus: firm ?Incision: Healing well with no significant drainage ?Labs: ?Lab Results  ?Component Value Date  ? WBC 9.0 09/19/2021  ? HGB 9.4 (L) 09/19/2021  ? HCT 29.2 (L) 09/19/2021  ? MCV 85.1 09/19/2021  ? PLT 191 09/19/2021  ? ? ?  Latest Ref Rng & Units 09/19/2021  ?  7:37 AM  ?CMP  ?Glucose 70 - 99 mg/dL 99    ?BUN 6 - 20 mg/dL 5    ?Creatinine 0.44 - 1.00 mg/dL 11/19/2021    ?Sodium 135 - 145 mmol/L 138    ?Potassium 3.5 - 5.1 mmol/L 3.5    ?Chloride 98 - 111 mmol/L 108    ?CO2 22 - 32 mmol/L 22    ?Calcium 8.9 - 10.3 mg/dL 8.7    ?Total Protein 6.5 - 8.1 g/dL 5.8    ?Total Bilirubin 0.3 - 1.2 mg/dL 0.5    ?Alkaline Phos 38 - 126 U/L 73    ?AST 15 - 41 U/L 28    ?ALT 0 - 44 U/L 41    ? ?Edinburgh Score: ? ?  09/19/2021  ?  9:17 AM  ?Inocente Salles Postnatal Depression Scale Screening Tool  ?I have been able to laugh and see the funny side of things. 0  ?I have looked forward with enjoyment to things. 0  ?I have blamed myself unnecessarily when things went wrong. 0  ?I have been anxious or worried for no good reason. 1  ?I have felt scared or panicky for no good reason. 0  ?Things have been getting on top of me. 0  ?I have been so unhappy that I have had difficulty sleeping. 0  ?I have felt sad or miserable. 0  ?I have been so unhappy that I have been crying. 0  ?The thought of harming myself has occurred to me. 0  ?Edinburgh Postnatal Depression Scale Total 1  ? ? ? ? ?After visit meds:  ?Allergies as of 09/20/2021   ?No Known Allergies ?  ? ?  ?Medication List  ?  ? ?STOP taking these medications   ? ?AMLODIPINE-ATORVASTATIN PO ?  ?aspirin EC 81 MG tablet ?  ?lisinopril 30 MG tablet ?Commonly known as: ZESTRIL ?  ?metFORMIN 500 MG (MOD) 24 hr tablet ?Commonly known as: GLUMETZA ?  ? ?  ? ?TAKE these medications   ? ?labetalol 300 MG tablet ?Commonly known as:  NORMODYNE ?Take 2 tablets (600 mg total) by mouth every 8 (eight) hours. ?  ?multivitamin-prenatal 27-0.8 MG Tabs tablet ?Take 1 tablet by mouth daily at 12 noon. ?  ?NIFEdipine 60 MG 24 hr tablet ?Commonly known as: ADALAT CC ?Take 60 mg by mouth daily. ?  ?NIFEdipine 30 MG 24 hr tablet ?Commonly known as: ADALAT CC ?Take 30 mg by mouth daily at 8 pm. ?  ?oxyCODONE 5 MG immediate release tablet ?Commonly known as: Oxy IR/ROXICODONE ?Take 1 tablet (5 mg total) by mouth every 4 (four) hours as needed for severe pain. ?  ? ?  ? ? ? ?Discharge home in stable condition ?Infant Feeding: Breast ?Infant Disposition:home with mother ?Discharge instruction: per After Visit Summary and Postpartum booklet. ?Activity: Advance as tolerated. Pelvic rest for 6 weeks.  ?Diet: routine diet ?Postpartum Appointment:2-3 days ?Follow up Visit: ? Follow-up Information   ? ? Bang[place], Cecilia Worema, DO Follow up in 3 day(s).   ?Specialty: Obstetrics and Gynecology ?Why: for BP check ?Contact information: ?[address] ?[address] ?[place] [address] ?[phone] ? ? ?  ?  ? ?  ?  ? ?  ? ? ? ?  ? ?09/20/2021 ?Zenaida Niece, MD ? ? ?

## 2021-09-20 NOTE — Discharge Instructions (Signed)
As per discharge pamphlet °

## 2021-09-20 NOTE — Progress Notes (Signed)
Pt discharged after d/c instructions given. Pt verbalized understanding and was in stable condition. All belongings sent with pt.  ?

## 2021-09-20 NOTE — Lactation Note (Signed)
This note was copied from a baby's chart. ?Lactation Consultation Note ? ?Patient Name: Lynn Gallegos ?Today's Date: 09/20/2021 ?Reason for consult: Follow-up assessment;Infant weight loss;Early term 37-38.6wks;Maternal endocrine disorder ?Age:34 days ? ?LC in to visit with P2 Mom of ET infant on day of discharge.  Baby gained 37gms from yesterday, but is still at 9.6% weight loss.  Mom states baby is going to the breast and is latching comfortably.  Mom is hearing swallows.  Baby is being supplemented with donor breast milk/EBM by paced bottle, volumes of 30+ ml.  ? ?Baby had just been fed and was sleeping and Mom was packing up for discharge at present. ? ?Mom admits to not consistently pumping her breasts.  Last pumping overnight was 60 ml.  Engorgement prevention and treatment reviewed. ? ?Talked about the importance of lactation f/u.  Mom desires this and message sent to [facility] RN IBCLC. ? ?Encouraged STS with baby, offering the breast with a deep latch to the breast with feeding cues.  Baby is to be fed 8-12 times per 24 hrs. ? ?Mom has a DEBP at home, reviewed importance of pump parts washing and drying and milk storage guidelines. ? ?Mom aware of OP lactation support and encouraged to call prn for concerns or questions. ? ?Lactation Tools Discussed/Used ?Tools: Pump;Bottle ?Breast pump type: Double-Electric Breast Pump ?Pump Education: Setup, frequency, and cleaning;Milk Storage ?Reason for Pumping: Support milk supply/infant weight loss ?Pumping frequency: Encouraged to pump after each breastfeeding when baby is supplemented after ?Pumped volume: 60 mL ? ?Interventions ?Interventions: Breast feeding basics reviewed;Skin to skin;Breast massage;Hand express;DEBP;Education ? ?Discharge ?Discharge Education: Engorgement and breast care;Warning signs for feeding baby;Outpatient recommendation;Outpatient Epic message sent ?Pump: Personal (Motif and a hand's free pump) ? ?Consult Status ?Consult Status:  Complete ?Date: 09/20/21 ?Follow-up type: Out-patient ? ? ? ?Johny Blamer E ?09/20/2021, 12:30 PM ? ? ? ?

## 2021-09-24 ENCOUNTER — Telehealth (HOSPITAL_COMMUNITY): Payer: Self-pay

## 2021-09-24 NOTE — Telephone Encounter (Signed)
"  I'm doing alright." Patient declines questions or concerns about her healing. "This is my 2nd time around, it's going smoothly. I took the honeycomb off and it's going well." ? ?"He is doing great. Sleeping good and gaining weight. He sleeps in a bassinet next to my bed." ?RN reviewed ABC's of safe sleep with patient. Patient declines any questions or concerns about baby. ? ?EPDS score is 1. ? ?Marcelino Duster Navada Osterhout,RN3,MSN,RNC-MNN ?05/12//2023,1646 ? ? ?

## 2021-10-07 ENCOUNTER — Encounter (HOSPITAL_BASED_OUTPATIENT_CLINIC_OR_DEPARTMENT_OTHER): Payer: Self-pay | Admitting: Emergency Medicine

## 2021-10-07 ENCOUNTER — Emergency Department (HOSPITAL_BASED_OUTPATIENT_CLINIC_OR_DEPARTMENT_OTHER): Payer: Medicaid Other

## 2021-10-07 ENCOUNTER — Other Ambulatory Visit: Payer: Self-pay

## 2021-10-07 ENCOUNTER — Emergency Department (HOSPITAL_BASED_OUTPATIENT_CLINIC_OR_DEPARTMENT_OTHER)
Admission: EM | Admit: 2021-10-07 | Discharge: 2021-10-07 | Discharge status: Against Medical Advice | Payer: Medicaid Other | Attending: Emergency Medicine | Admitting: Emergency Medicine

## 2021-10-07 DIAGNOSIS — J90 Pleural effusion, not elsewhere classified: Secondary | ICD-10-CM | POA: Insufficient documentation

## 2021-10-07 DIAGNOSIS — K760 Fatty (change of) liver, not elsewhere classified: Secondary | ICD-10-CM | POA: Insufficient documentation

## 2021-10-07 DIAGNOSIS — R079 Chest pain, unspecified: Secondary | ICD-10-CM | POA: Diagnosis not present

## 2021-10-07 DIAGNOSIS — R1011 Right upper quadrant pain: Secondary | ICD-10-CM | POA: Insufficient documentation

## 2021-10-07 DIAGNOSIS — K819 Cholecystitis, unspecified: Secondary | ICD-10-CM

## 2021-10-07 DIAGNOSIS — K810 Acute cholecystitis: Secondary | ICD-10-CM | POA: Diagnosis present

## 2021-10-07 DIAGNOSIS — K8000 Calculus of gallbladder with acute cholecystitis without obstruction: Secondary | ICD-10-CM | POA: Insufficient documentation

## 2021-10-07 DIAGNOSIS — D72829 Elevated white blood cell count, unspecified: Secondary | ICD-10-CM | POA: Insufficient documentation

## 2021-10-07 LAB — CBC WITH DIFFERENTIAL/PLATELET
Abs Immature Granulocytes: 0.04 10*3/uL (ref 0.00–0.07)
Basophils Absolute: 0 10*3/uL (ref 0.0–0.1)
Basophils Relative: 0 %
Eosinophils Absolute: 0.2 10*3/uL (ref 0.0–0.5)
Eosinophils Relative: 1 %
HCT: 34.4 % — ABNORMAL LOW (ref 36.0–46.0)
Hemoglobin: 11.2 g/dL — ABNORMAL LOW (ref 12.0–15.0)
Immature Granulocytes: 0 %
Lymphocytes Relative: 11 %
Lymphs Abs: 1.3 10*3/uL (ref 0.7–4.0)
MCH: 26.5 pg (ref 26.0–34.0)
MCHC: 32.6 g/dL (ref 30.0–36.0)
MCV: 81.3 fL (ref 80.0–100.0)
Monocytes Absolute: 0.5 10*3/uL (ref 0.1–1.0)
Monocytes Relative: 4 %
Neutro Abs: 9.9 10*3/uL — ABNORMAL HIGH (ref 1.7–7.7)
Neutrophils Relative %: 84 %
Platelets: 315 10*3/uL (ref 150–400)
RBC: 4.23 MIL/uL (ref 3.87–5.11)
RDW: 13.4 % (ref 11.5–15.5)
WBC: 11.9 10*3/uL — ABNORMAL HIGH (ref 4.0–10.5)
nRBC: 0 % (ref 0.0–0.2)

## 2021-10-07 LAB — COMPREHENSIVE METABOLIC PANEL
ALT: 51 U/L — ABNORMAL HIGH (ref 0–44)
AST: 79 U/L — ABNORMAL HIGH (ref 15–41)
Albumin: 3.7 g/dL (ref 3.5–5.0)
Alkaline Phosphatase: 220 U/L — ABNORMAL HIGH (ref 38–126)
Anion gap: 7 (ref 5–15)
BUN: 16 mg/dL (ref 6–20)
CO2: 26 mmol/L (ref 22–32)
Calcium: 9 mg/dL (ref 8.9–10.3)
Chloride: 106 mmol/L (ref 98–111)
Creatinine, Ser: 0.83 mg/dL (ref 0.44–1.00)
GFR, Estimated: >60 mL/min (ref >60)
Glucose, Bld: 128 mg/dL — ABNORMAL HIGH (ref 70–99)
Potassium: 3.5 mmol/L (ref 3.5–5.1)
Sodium: 139 mmol/L (ref 135–145)
Total Bilirubin: 0.8 mg/dL (ref 0.3–1.2)
Total Protein: 7.8 g/dL (ref 6.5–8.1)

## 2021-10-07 LAB — LIPASE, BLOOD: Lipase: 38 U/L (ref 11–51)

## 2021-10-07 LAB — TROPONIN I (HIGH SENSITIVITY)
Troponin I (High Sensitivity): 5 ng/L (ref <18)
Troponin I (High Sensitivity): 6 ng/L (ref <18)

## 2021-10-07 MED ORDER — SODIUM CHLORIDE 0.9 % IV SOLN: INTRAVENOUS | Status: AC

## 2021-10-07 MED ORDER — SODIUM CHLORIDE 0.9 % IV SOLN
2.0000 g | Freq: Once | INTRAVENOUS | Status: AC
  Administered 2021-10-07: 2 g via INTRAVENOUS
  Filled 2021-10-07: qty 20

## 2021-10-07 MED ORDER — ONDANSETRON HCL 4 MG/2ML IJ SOLN
4.0000 mg | Freq: Three times a day (TID) | INTRAMUSCULAR | Status: DC | PRN
  Filled 2021-10-07: qty 2

## 2021-10-07 MED ORDER — FENTANYL CITRATE PF 50 MCG/ML IJ SOSY
50.0000 ug | PREFILLED_SYRINGE | Freq: Once | INTRAMUSCULAR | Status: DC
  Filled 2021-10-07: qty 1

## 2021-10-07 MED ORDER — ALUM & MAG HYDROXIDE-SIMETH 200-200-20 MG/5ML PO SUSP
30.0000 mL | Freq: Once | ORAL | Status: AC
Start: 2021-10-07 — End: 2021-10-07
  Administered 2021-10-07: 30 mL via ORAL
  Filled 2021-10-07: qty 30

## 2021-10-07 MED ORDER — IOHEXOL 350 MG/ML SOLN
100.0000 mL | Freq: Once | INTRAVENOUS | Status: AC | PRN
  Administered 2021-10-07: 100 mL via INTRAVENOUS

## 2021-10-07 NOTE — ED Triage Notes (Signed)
Back pain since 9pm, progressed to chest shortly after, tried to sleep woke up to pain in right side pain. Was told if having pain to come to hospital.

## 2021-10-07 NOTE — ED Notes (Signed)
Pt decided she wants to leave AMA. ([place]) EDP made aware of the same. IV removed, pt signed form, carelink/bedplacement made aware of the same.

## 2021-10-07 NOTE — ED Provider Notes (Addendum)
[facility] EMERGENCY DEPARTMENT Provider Note   CSN: [id number] Arrival date & time: 10/07/21  0042     History  Chief Complaint  Patient presents with   Chest Pain    Lynn Gallegos is a 36 y.o. female.  The history is provided by the patient.  Abdominal Pain Pain location:  RUQ Pain quality: sharp   Pain radiates to:  Chest and R flank Pain severity:  Severe Onset quality:  Gradual Duration:  4 hours Timing:  Constant Progression:  Unchanged Chronicity:  New Context: not recent travel and not trauma   Relieved by:  Nothing Worsened by:  Nothing Ineffective treatments: home narcotics. Associated symptoms: no cough, no diarrhea, no fever, no shortness of breath and no vomiting   Risk factors: no alcohol abuse and not pregnant   Risk factors comment:  Recent C section     Home Medications Prior to Admission medications   Medication Sig Start Date End Date Taking? Authorizing Provider  labetalol (NORMODYNE) 300 MG tablet Take 2 tablets (600 mg total) by mouth every 8 (eight) hours. 09/20/21   Meisinger, Tawanna Cooler, MD  NIFEdipine (ADALAT CC) 30 MG 24 hr tablet Take 30 mg by mouth daily at 8 pm.    [provider]  NIFEdipine (ADALAT CC) 60 MG 24 hr tablet Take 60 mg by mouth daily.    [provider]  oxyCODONE (OXY IR/ROXICODONE) 5 MG immediate release tablet Take 1 tablet (5 mg total) by mouth every 4 (four) hours as needed for severe pain. 09/20/21   Meisinger, Tawanna Cooler, MD  Prenatal Vit-Fe Fumarate-FA (MULTIVITAMIN-PRENATAL) 27-0.8 MG TABS tablet Take 1 tablet by mouth daily at 12 noon.    [provider]      Allergies    Patient has no known allergies.    Review of Systems   Review of Systems  Constitutional:  Negative for fever.  HENT:  Negative for facial swelling.   Eyes:  Negative for photophobia.  Respiratory:  Negative for cough and shortness of breath.   Gastrointestinal:  Positive for abdominal pain. Negative for diarrhea and  vomiting.  Genitourinary:  Negative for difficulty urinating.  Musculoskeletal:  Negative for neck stiffness.  Skin:  Negative for rash.  Neurological:  Negative for facial asymmetry.  All other systems reviewed and are negative.  Physical Exam Updated Vital Signs BP 133/69   Pulse 77   Temp 98 F (36.7 C) (Oral)   Resp 14   Wt 130 kg   LMP 12/28/2020   SpO2 94%   Breastfeeding Yes   BMI 41.12 kg/m  Physical Exam Vitals and nursing note reviewed. Exam conducted with a chaperone present.  Constitutional:      General: She is not in acute distress.    Appearance: Normal appearance. She is well-developed. She is not diaphoretic.  HENT:     Head: Normocephalic and atraumatic.     Nose: Nose normal.  Eyes:     Pupils: Pupils are equal, round, and reactive to light.  Cardiovascular:     Rate and Rhythm: Normal rate and regular rhythm.  Pulmonary:     Effort: Pulmonary effort is normal. No respiratory distress.     Breath sounds: Normal breath sounds.  Abdominal:     General: Bowel sounds are normal. There is no distension.     Palpations: Abdomen is soft.     Tenderness: There is abdominal tenderness. There is no guarding or rebound.  Genitourinary:    Vagina: No vaginal  discharge.  Musculoskeletal:        General: Normal range of motion.     Cervical back: Neck supple.  Skin:    General: Skin is warm and dry.     Capillary Refill: Capillary refill takes less than 2 seconds.     Findings: No erythema or rash.  Neurological:     General: No focal deficit present.     Mental Status: She is alert and oriented to person, place, and time.     Deep Tendon Reflexes: Reflexes normal.    ED Results / Procedures / Treatments   Labs (all labs ordered are listed, but only abnormal results are displayed) Results for orders placed or performed during the hospital encounter of 10/07/21  CBC with Differential  Result Value Ref Range   WBC 11.9 (H) 4.0 - 10.5 K/uL   RBC 4.23  3.87 - 5.11 MIL/uL   Hemoglobin 11.2 (L) 12.0 - 15.0 g/dL   HCT 95.6 (L) 38.7 - 56.4 %   MCV 81.3 80.0 - 100.0 fL   MCH 26.5 26.0 - 34.0 pg   MCHC 32.6 30.0 - 36.0 g/dL   RDW 33.2 95.1 - 88.4 %   Platelets 315 150 - 400 K/uL   nRBC 0.0 0.0 - 0.2 %   Neutrophils Relative % 84 %   Neutro Abs 9.9 (H) 1.7 - 7.7 K/uL   Lymphocytes Relative 11 %   Lymphs Abs 1.3 0.7 - 4.0 K/uL   Monocytes Relative 4 %   Monocytes Absolute 0.5 0.1 - 1.0 K/uL   Eosinophils Relative 1 %   Eosinophils Absolute 0.2 0.0 - 0.5 K/uL   Basophils Relative 0 %   Basophils Absolute 0.0 0.0 - 0.1 K/uL   Immature Granulocytes 0 %   Abs Immature Granulocytes 0.04 0.00 - 0.07 K/uL  Comprehensive metabolic panel  Result Value Ref Range   Sodium 139 135 - 145 mmol/L   Potassium 3.5 3.5 - 5.1 mmol/L   Chloride 106 98 - 111 mmol/L   CO2 26 22 - 32 mmol/L   Glucose, Bld 128 (H) 70 - 99 mg/dL   BUN 16 6 - 20 mg/dL   Creatinine, Ser 1.66 0.44 - 1.00 mg/dL   Calcium 9.0 8.9 - 06.3 mg/dL   Total Protein 7.8 6.5 - 8.1 g/dL   Albumin 3.7 3.5 - 5.0 g/dL   AST 79 (H) 15 - 41 U/L   ALT 51 (H) 0 - 44 U/L   Alkaline Phosphatase 220 (H) 38 - 126 U/L   Total Bilirubin 0.8 0.3 - 1.2 mg/dL   GFR, Estimated >01 >60 mL/min   Anion gap 7 5 - 15  Lipase, blood  Result Value Ref Range   Lipase 38 11 - 51 U/L  Troponin I (High Sensitivity)  Result Value Ref Range   Troponin I (High Sensitivity) 5 <18 ng/L  Troponin I (High Sensitivity)  Result Value Ref Range   Troponin I (High Sensitivity) 6 <18 ng/L   CT Angio Chest PE W and/or Wo Contrast  Result Date: 10/07/2021 CLINICAL DATA:  Back pain that progressed chest pain. EXAM: CT ANGIOGRAPHY CHEST WITH CONTRAST TECHNIQUE: Multidetector CT imaging of the chest was performed using the standard protocol during bolus administration of intravenous contrast. Multiplanar CT image reconstructions and MIPs were obtained to evaluate the vascular anatomy. RADIATION DOSE REDUCTION: This exam  was performed according to the departmental dose-optimization program which includes automated exposure control, adjustment of the mA and/or kV according to  patient size and/or use of iterative reconstruction technique. CONTRAST:  100mL OMNIPAQUE IOHEXOL 350 MG/ML SOLN COMPARISON:  None Available. FINDINGS: Cardiovascular: There is no evidence of thoracic aortic aneurysm or dissection. The subsegmental pulmonary arteries are limited in evaluation secondary to suboptimal opacification with intravenous contrast. No evidence of pulmonary embolism. Normal heart size. No pericardial effusion. Mediastinum/Nodes: No enlarged mediastinal, hilar, or axillary lymph nodes. Thyroid gland, trachea, and esophagus demonstrate no significant findings. Lungs/Pleura: Very mild atelectasis is seen within the bilateral lung bases. Small bilateral pleural effusions are noted. No pneumothorax is identified. Upper Abdomen: No acute abnormality. Musculoskeletal: No chest wall abnormality. No acute or significant osseous findings. Review of the MIP images confirms the above findings. IMPRESSION: 1. Limited study without evidence of pulmonary embolism. 2. Small bilateral pleural effusions. 3. Very mild bilateral lower lobe atelectasis. Electronically Signed   By: Aram Candelahaddeus  Houston M.D.   On: 10/07/2021 02:53   DG Chest Portable 1 View  Result Date: 10/07/2021 CLINICAL DATA:  Chest/back pain EXAM: PORTABLE CHEST 1 VIEW COMPARISON:  None Available. FINDINGS: Lungs are clear.  No pleural effusion or pneumothorax. The heart is top-normal in size. IMPRESSION: No evidence of acute cardiopulmonary disease. Electronically Signed   By: Charline BillsSriyesh  Krishnan M.D.   On: 10/07/2021 01:36   US Abdomen Limited RUQ (LIVER/GB)  Result Date: 10/07/2021 CLINICAL DATA:  Right upper quadrant abdominal pain EXAM: ULTRASOUND ABDOMEN LIMITED RIGHT UPPER QUADRANT COMPARISON:  None Available. FINDINGS: Gallbladder: Multiple layering gallstones are seen within  the gallbladder. There is, additionally, mild gallbladder wall thickening with the gallbladder wall measuring up to 4 mm in thickness, and trace pericholecystic fluid best appreciated along the hepatic wall of the gallbladder. The sonographic Eulah PontMurphy sign is reportedly negative. Common bile duct: Diameter: 4 mm in proximal diameter Liver: Hepatic parenchymal echogenicity is diffusely increased and there is diffuse coarsening of the hepatic echotexture in keeping with mild to moderate hepatic steatosis. No focal intrahepatic masses are seen and there is no intrahepatic biliary ductal dilation. Portal vein is patent on color Doppler imaging with normal direction of blood flow towards the liver. Other: None. IMPRESSION: Cholelithiasis with changes suggesting superimposed acute cholecystitis. Correlation with liver enzymes would be helpful. Mild to moderate hepatic steatosis. Electronically Signed   By: Helyn NumbersAshesh  Parikh M.D.   On: 10/07/2021 02:04     EKG EKG Interpretation  Date/Time:  Thursday Oct 07 2021 00:55:01 EDT Ventricular Rate:  90 PR Interval:  152 QRS Duration: 85 QT Interval:  362 QTC Calculation: 443 R Axis:   63 Text Interpretation: Sinus rhythm Confirmed by Aasiya Creasey ([id number]) on 10/07/2021 1:21:43 AM  Radiology CT Angio Chest PE W and/or Wo Contrast  Result Date: 10/07/2021 CLINICAL DATA:  Back pain that progressed chest pain. EXAM: CT ANGIOGRAPHY CHEST WITH CONTRAST TECHNIQUE: Multidetector CT imaging of the chest was performed using the standard protocol during bolus administration of intravenous contrast. Multiplanar CT image reconstructions and MIPs were obtained to evaluate the vascular anatomy. RADIATION DOSE REDUCTION: This exam was performed according to the departmental dose-optimization program which includes automated exposure control, adjustment of the mA and/or kV according to patient size and/or use of iterative reconstruction technique. CONTRAST:  100mL OMNIPAQUE IOHEXOL  350 MG/ML SOLN COMPARISON:  None Available. FINDINGS: Cardiovascular: There is no evidence of thoracic aortic aneurysm or dissection. The subsegmental pulmonary arteries are limited in evaluation secondary to suboptimal opacification with intravenous contrast. No evidence of pulmonary embolism. Normal heart size. No pericardial effusion. Mediastinum/Nodes: No enlarged mediastinal,  hilar, or axillary lymph nodes. Thyroid gland, trachea, and esophagus demonstrate no significant findings. Lungs/Pleura: Very mild atelectasis is seen within the bilateral lung bases. Small bilateral pleural effusions are noted. No pneumothorax is identified. Upper Abdomen: No acute abnormality. Musculoskeletal: No chest wall abnormality. No acute or significant osseous findings. Review of the MIP images confirms the above findings. IMPRESSION: 1. Limited study without evidence of pulmonary embolism. 2. Small bilateral pleural effusions. 3. Very mild bilateral lower lobe atelectasis. Electronically Signed   By: Aram Candela M.D.   On: 10/07/2021 02:53   DG Chest Portable 1 View  Result Date: 10/07/2021 CLINICAL DATA:  Chest/back pain EXAM: PORTABLE CHEST 1 VIEW COMPARISON:  None Available. FINDINGS: Lungs are clear.  No pleural effusion or pneumothorax. The heart is top-normal in size. IMPRESSION: No evidence of acute cardiopulmonary disease. Electronically Signed   By: Charline Bills M.D.   On: 10/07/2021 01:36   US Abdomen Limited RUQ (LIVER/GB)  Result Date: 10/07/2021 CLINICAL DATA:  Right upper quadrant abdominal pain EXAM: ULTRASOUND ABDOMEN LIMITED RIGHT UPPER QUADRANT COMPARISON:  None Available. FINDINGS: Gallbladder: Multiple layering gallstones are seen within the gallbladder. There is, additionally, mild gallbladder wall thickening with the gallbladder wall measuring up to 4 mm in thickness, and trace pericholecystic fluid best appreciated along the hepatic wall of the gallbladder. The sonographic Eulah Pont sign is  reportedly negative. Common bile duct: Diameter: 4 mm in proximal diameter Liver: Hepatic parenchymal echogenicity is diffusely increased and there is diffuse coarsening of the hepatic echotexture in keeping with mild to moderate hepatic steatosis. No focal intrahepatic masses are seen and there is no intrahepatic biliary ductal dilation. Portal vein is patent on color Doppler imaging with normal direction of blood flow towards the liver. Other: None. IMPRESSION: Cholelithiasis with changes suggesting superimposed acute cholecystitis. Correlation with liver enzymes would be helpful. Mild to moderate hepatic steatosis. Electronically Signed   By: Helyn Numbers M.D.   On: 10/07/2021 02:04    Procedures Procedures    Medications Ordered in ED Medications  cefTRIAXone (ROCEPHIN) 2 g in sodium chloride 0.9 % 100 mL IVPB (2 g Intravenous New Bag/Given 10/07/21 0317)  fentaNYL (SUBLIMAZE) injection 50 mcg (has no administration in time range)  0.9 %  sodium chloride infusion ( Intravenous New Bag/Given 10/07/21 0315)  ondansetron (ZOFRAN) injection 4 mg (has no administration in time range)  alum & mag hydroxide-simeth (MAALOX/MYLANTA) 200-200-20 MG/5ML suspension 30 mL (30 mLs Oral Given 10/07/21 0142)  iohexol (OMNIPAQUE) 350 MG/ML injection 100 mL (100 mLs Intravenous Contrast Given 10/07/21 0241)    ED Course/ Medical Decision Making/ A&P                           Medical Decision Making Patient with RUQ and R flank pain radiated to chest recently delivered   Amount and/or Complexity of Data Reviewed External Data Reviewed: notes.    Details: previous notes reviewed Labs: ordered.    Details: all labs reviewed: elevated white count 11.9 hemoglobin 11.2 slight low, normal platelets, negative troponin.  Sodium 139, normal potassium 3.5 normal creatinine, elevated LFTS Radiology: ordered.    Details: cholecystitis by me on [place] no PE by me on CTA ECG/medicine tests: ordered and independent  interpretation performed. Decision-making details documented in ED Course. Discussion of management or test interpretation with external provider(s): Case d/w Dr. Maisie Fus, admit [facility] med surg [facility]  Risk OTC drugs. Prescription drug management. Parenteral controlled substances. Decision regarding  hospitalization. Risk Details: Acute cholecystitis.  I explained to the patient that based on labs and ultrasound she has acute cholecystitis secondary to gall stones.  I explained that I have spoke to the surgeon and the plan is to admit to remove the gall bladder given the stone that is stuck and infection.  I have explained we would transfer the patient to an inpatient bed a [facility].  Patient expressed understanding.  The patient appears reasonably stabilized for admission considering the current resources, flow, and capabilities available in the ED at this time, and I doubt any other [facility] requiring further screening and/or treatment in the ED prior to admission.    Final Clinical Impression(s) / ED Diagnoses Final diagnoses:  None   I admitted the patient and informed the patient she was being admitted for acute cholecystitis.  I was informed more than 40 minutes later that patient's ride was here and patient was leaving.  Patient states she was leaving.  Patient is AO4 and has decision making capacity to refuse care.  She has decided to leave against medical advice. She verbalizes understanding of the risks of leaving against medical advice. These are but are not limited to death, sepsis, perforated gall bladder and prolonged morbidity from untreated disease.  Patient is accepting these risk and is leaving against medical advice.  She is welcome to return at any time     [facility], Tenea Sens, MD 10/07/21 [phone]

## 2021-10-07 NOTE — ED Notes (Signed)
US at bedside

## 2021-10-13 ENCOUNTER — Inpatient Hospital Stay (HOSPITAL_COMMUNITY): Admit: 2021-10-13 | Payer: Medicaid Other | Admitting: Obstetrics and Gynecology

## 2021-10-13 ENCOUNTER — Encounter (HOSPITAL_COMMUNITY): Payer: Self-pay

## 2021-10-13 SURGERY — Surgical Case
Anesthesia: Spinal

## 2021-11-23 NOTE — Progress Notes (Signed)
Cardiology Office Note   Date:  11/29/2021   ID:  Lynn Gallegos, DOB February 03, 1986, MRN [id number]  PCP:  Pcp, No  Cardiologist:   Klay Sobotka [place], MD   Chief Complaint  Patient presents with   Coronary Artery Disease      History of Present Illness: Lynn Gallegos is a 36 y.o. female who is seen at the request of Dr Earlene Plater for evaluation of SCAD. She has a history of HTN, gestational diabetes and obesity. She presented to [facility] on 11/14/21 with complaints of severe chest pain. Pain radiated to her right arm and wrist and was associated with SOB. She was severely hypertensive with BP 180/115. Ecg reportedly showed ST elevation in anterior leads. She was given ASA, sl Ntg and IV labetalol with reported resolution of Ecg changes and improvement in chest pain. Patient reports symptoms lasted 20- 30 minutes then resolved. Hgb was 10.8. normal BNP. Normal glucose. Troponin peaked at 14.3. She was treated with Plavix, diltiazem and transferred to [facility] for further evaluation. Chest CT was negative. Echo showed EF 40-45% with segmental WMA involving the mid and distal anteroseptal, apex and mid inferior wall. Mild LVH. Cardiac cath showed severe disease in the LAD and second diagonal c/w SCAD. She was treated medically and DC on ASA, Plavix. Bystolic 20 mg daily. Procardia XL 60 mg in am and 30 mg in pm, Diltiazem 120 mg daily, Imdur 30 mg daily and telmisartan 20 mg daily.   The patient delivered her second pregnancy on May 4. This was complicated by gestational diabetes and pre-eclampsia. She delivered by C section. She reports HTN since her early 25s. She is not currently breast feeding. Mother died with a cerebral aneurysm in her 58s.  Since hospital DC she has felt well. No chest pain, dyspnea, palpitations, dizziness. She did develop a "rash" on her left arm which was her access for cardiac cath. This is improving.     Past Medical History:  Diagnosis Date   Gestational  diabetes    Hypertension     Past Surgical History:  Procedure Laterality Date   CESAREAN SECTION N/A 06/26/2014   Procedure: CESAREAN SECTION;  Surgeon: Brock Bad, MD;  Location: [facility] ORS;  Service: Obstetrics;  Laterality: N/A;   CESAREAN SECTION N/A 09/16/2021   Procedure: CESAREAN SECTION;  Surgeon: Huel Cote, MD;  Location: [facility] LD ORS;  Service: Obstetrics;  Laterality: N/A;   CESAREAN SECTION       Current Outpatient Medications  Medication Sig Dispense Refill   aspirin EC 81 MG tablet Take by mouth.     clopidogrel (PLAVIX) 75 MG tablet Take by mouth.     empagliflozin (JARDIANCE) 10 MG TABS tablet Take 1 tablet (10 mg total) by mouth daily before breakfast. 90 tablet 3   Ferrous Sulfate (IRON PO) Take 1 tablet by mouth every morning.     Nebivolol HCl 20 MG TABS Take by mouth.     NIFEdipine (ADALAT CC) 60 MG 24 hr tablet Take 60 mg by mouth daily.     Prenatal Vit-Fe Fumarate-FA (MULTIVITAMIN-PRENATAL) 27-0.8 MG TABS tablet Take 1 tablet by mouth daily at 12 noon.     sacubitril-valsartan (ENTRESTO) 49-51 MG Take 1 tablet by mouth 2 (two) times daily. 60 tablet 11   spironolactone (ALDACTONE) 25 MG tablet Take 0.5 tablets (12.5 mg total) by mouth daily. 45 tablet 3   No current facility-administered medications for this visit.    Allergies:  Patient has no known allergies.    Social History:  The patient  reports that she has never smoked. She has been exposed to tobacco smoke. She has never used smokeless tobacco. She reports that she does not drink alcohol and does not use drugs.   Family History:  The patient's family history includes Aneurysm (age of onset: 26) in her mother; Cancer in her paternal grandmother; Diabetes in her father; Hypertension in her maternal grandmother and mother.    ROS:  Please see the history of present illness.   Otherwise, review of systems are positive for none.   All other systems are reviewed and negative.    PHYSICAL  EXAM: VS:  BP 118/72   Pulse 72   Ht 5\' 10"  (1.778 m)   Wt 263 lb (119.3 kg)   LMP 12/28/2020   BMI 37.74 kg/m  , BMI Body mass index is 37.74 kg/m. GEN: Well nourished, well developed, in no acute distress HEENT: normal Neck: no JVD, carotid bruits, or masses Cardiac: RRR; no murmurs, rubs, or gallops,no edema  Respiratory:  clear to auscultation bilaterally, normal work of breathing GI: soft, nontender, nondistended, + BS MS: no deformity or atrophy Skin: warm and dry, no rash Neuro:  Strength and sensation are intact Psych: euthymic mood, full affect   EKG:  EKG is ordered today. The ekg ordered today demonstrates NSR rate 72. Diffuse anterior T wave inversion. I have personally reviewed and interpreted this study.    Recent Labs: 09/17/2021: Magnesium 4.6 10/07/2021: ALT 51; BUN 16; Creatinine, Ser 0.83; Hemoglobin 11.2; Platelets 315; Potassium 3.5; Sodium 139   Dated 11/04/21: Hgb 11.7. CMET normal. Dated 11/14/21: cholesterol 134, triglycerides 118, LDL 65, HDL 45.   Lipid Panel No results found for: "CHOL", "TRIG", "HDL", "CHOLHDL", "VLDL", "LDLCALC", "LDLDIRECT"    Wt Readings from Last 3 Encounters:  11/29/21 263 lb (119.3 kg)  10/07/21 286 lb 9.6 oz (130 kg)  09/15/21 286 lb 9.6 oz (130 kg)      Other studies Reviewed: Additional studies/ records that were reviewed today include:   Echo 11/14/21: Summary    Left Ventricle: Segmental wall motion abnormality present. The ejection fraction is mildly decreased. The estimated ejection fraction is 40-45%. Diastolic function is normal.   Tricuspid Valve: There is no evidence of pulmonary hypertension.  Hyperkinesis Not evaluated Normal wall motion  Moderate hypokinesis Akinesis Dyskinesis  Aneurysm Mild hypokinesis Severe hypokinesis Narrative  This result has an attachment that is not available.   Vital Signs  Height: 1.77 m. Weight: 122.0 kg. BSA: 2.45 m. BP: 144/93 mm Hg.  Heart rate: 68 bpm.   Procedure   Study quality: Technically difficult due to body habitus. Study type: 2D, m-mode and color flow Doppler study was performed. Status: stat.  Technologist:  01/15/22.    Reason for exam:  Myocardial Infarction.  2D  LVIDd 42 mm      (38-52) FS 36.1 %  LA diam systole 34 mm  LVIDd I 17.15 mm/m  (23.00-31.00) IVSd 13 mm  (6-9) LA diam systole Index 13.92 mm/m  LVIDs 27 mm      (22-35) LVPWd 13 mm  (6-9)  TAPSE 18 mm      (>=17)  LV Mass-c 208 g  (67-162)  LV MassI 85.0 g/m      (43.0-95.0)  LVOT diam 21 mm  Ao diam diastole 34 mm   Doppler  TR Vmax 2.25 m/s  AV VTI 21.8 cm  MV PGmax 2.30 mm  Hg  TR PGmax 20.33 mm Hg  AVAI (Vmax) 0.86 cm/m  MV PGmean 1.05 mm Hg  RVOT PGmax 2.31 mm Hg  AVA (VTI) 2.08 cm  (3.00-4.00) MVA (PHT) 3.94 cm  RVSP 23.33 mm Hg  AV Vmax 1.02 m/s  RAP 3.00 mm Hg  AV PGmax 4.13 mm Hg  PV Vmax 1.02 m/s  AV PGmean 2.53 mm Hg  PV PGmax 4.13 mm Hg  AVA (Vmax) 2.10 cm  (3.00-4.00)  LVOT Vmax 0.64 m/s  AVAI (VTI) 0.85 cm/m  LVOT PGmax 1.62 mm Hg  LVOT VTI/AV VTI 0.62  LVOT VTI [id number] cm   Diastolic Function  MV E Vel 0.52 m/s  E'(l) 4.65 cm/s  (14.00-25.60)  MV A Vel 0.67 m/s  E'(s) 5.72 cm/s  (10.10-20.90)  MV E/A 0.77      (0.73-2.33) E/E'(l) 11.20  MV Dec. Time 192 ms      (138-194) E/E'(s) 9.10  E/E' mean 10.04   Left Ventricle  The left ventricle cavity size is normal. Segmental wall motion abnormality present. The ejection fraction is mildly decreased. The estimated ejection fraction is 40-45%. The diastolic filling pattern is normal. Severe hypokinesis: mid anterior septal, mid inferior septal, apical septal, apex. Normal wall motion: all remaining segments. There is mild concentric LV hypertrophy visualized.   Left Atrium  The left atrium cavity size is normal.   Right Atrium  The right atrium cavity size is normal.   Right Ventricle  The right ventricle cavity size is normal. Normal systolic function is visualized.   Aortic Valve  Normal aortic valve  structure with no stenosis or regurgitation.   Mitral Valve  Normal mitral valve structure with no stenosis or regurgitation.   Tricuspid Valve  Normal Tricuspid valve structure with no stenosis or regurgitation. The pulmonary pressure is normal. There is no evidence of pulmonary hypertension. Trace amount of tricuspid regurgitation is visualized.  RVSP 23.33 mm Hg  RAP 3.00 mm Hg  TR Vmax 2.25 m/s   Pulmonic Valve  Normal pulmonic valve structure with no stenosis or regurgitation.   Pericardium  The pericardium is normal.   Aorta  here is a normal size of aorta, normal ascending aorta and aortic arch.   Pulmonary Artery  Normal pulmonary artery with no dilatation.   IVC / Hepatic Veins  The inferior vena cava is normal in size with respiratory change > 50 %. 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  Diffuse, severe narrowing.    Patient had transient ST segment elevation 2 days prior to cardiac  catheterization.  Symptoms and ST segment elevation have resolved without  recurrence on medical therapy.  Echocardiogram demonstrated anterior  apical and septal hypokinesis with ejection fraction 40-45%.      RECOMMENDATIONS:   Recommend intensive medical therapy with in-hospital observation 3-5 days  prior to discharge.  Reserve percutaneous coronary intervention for  recurrent, refractory angina given the high risk of complications in the  setting SCAD. Narrative  This result has an attachment that is not available.  Images from the original result were not included.     Dist LAD lesion is 90% stenosed.    3rd Diag lesion is 90% stenosed.   Coronary Findings Diagnostic Dominance: Right  Left Anterior Descending: Dist LAD lesion is 90% stenosed. Third Diagonal Branch: 3rd Diag lesion is 90% stenosed.  First  Obtuse Marginal Branch: The vessel is small in caliber.   Intervention  No interventions have been documented. Performed Procedures  LEFT HEART CATH WITH CORONARY ANGIOGRAPHY Exam End: 11/15/21 10:57 Last Resulted: 11/17/21 21:34  Received From: [facility]    ASSESSMENT AND PLAN:  1.  S/p NSTEMI with SCAD involving the mid to distal LAD and second diagonal. I have personally reviewed her cardiac cath films and agree with findings. She is asymptomatic. Recommend continue DAPT for one year. Optimize CHF therapy. Given association of SCAD with fibromuscular dysplasia will check carotid and renal artery dopplers.  2. Chronic systolic CHF. EF 40-45%. Need to optimize medical therapy. Will DC diltiazem and Imdur. Reduce nifedipine to 60 mg daily. Switch telmisartan to Entresto 49/51 mg bid. Continue Bystolic, add aldactone 12.5 mg daily. Add Jardiance 10 mg daily. Will check BMET later this week. Follow up with pharm D in 3 weeks for medication titration. Plan to repeat Echo in 3 months 3. HTN longstanding. Check renal duplex. Optimize meds as noted.  4. Gestational DM 5. Pre-eclampsia. Patient is not planning on having more children. Is going to consider an IUD so she can avoid hormonal therapy. Unfortunately with her cardiac medication she should not breast feed.    Current medicines are reviewed at length with the patient today.  The patient does not have concerns regarding medicines.  The following changes have been made:  see above  Labs/ tests ordered today include:   Orders Placed This Encounter  Procedures   Basic metabolic panel   EKG 12-Lead   VAS US CAROTID   VAS US RENAL ARTERY DUPLEX         Disposition:   FU with APP in 2 months  Signed, Natacia Chaisson [place], MD  11/29/2021 8:53 AM    [facility] [address] [place], [place], [address] Phone [phone], Fax [phone]

## 2021-11-25 ENCOUNTER — Other Ambulatory Visit: Payer: Self-pay | Admitting: Cardiology

## 2021-11-25 ENCOUNTER — Ambulatory Visit
Admission: RE | Admit: 2021-11-25 | Discharge: 2021-11-25 | Disposition: A | Discharge status: Home / Self Care | Payer: Self-pay | Source: Ambulatory Visit | Attending: Cardiology | Admitting: Cardiology

## 2021-11-25 DIAGNOSIS — I25110 Atherosclerotic heart disease of native coronary artery with unstable angina pectoris: Secondary | ICD-10-CM

## 2021-11-25 NOTE — Progress Notes (Signed)
Have contacted [facility] to [facility] cath films from 11/15/21

## 2021-11-29 ENCOUNTER — Encounter: Payer: Self-pay | Admitting: Cardiology

## 2021-11-29 ENCOUNTER — Ambulatory Visit (INDEPENDENT_AMBULATORY_CARE_PROVIDER_SITE_OTHER): Payer: Medicaid Other | Admitting: Cardiology

## 2021-11-29 VITALS — BP 118/72 | HR 72 | Ht 70.0 in | Wt 263.0 lb

## 2021-11-29 DIAGNOSIS — I2542 Coronary artery dissection: Secondary | ICD-10-CM

## 2021-11-29 DIAGNOSIS — I10 Essential (primary) hypertension: Secondary | ICD-10-CM

## 2021-11-29 DIAGNOSIS — I25110 Atherosclerotic heart disease of native coronary artery with unstable angina pectoris: Secondary | ICD-10-CM | POA: Diagnosis not present

## 2021-11-29 DIAGNOSIS — I5022 Chronic systolic (congestive) heart failure: Secondary | ICD-10-CM

## 2021-11-29 DIAGNOSIS — I25118 Atherosclerotic heart disease of native coronary artery with other forms of angina pectoris: Secondary | ICD-10-CM | POA: Diagnosis not present

## 2021-11-29 MED ORDER — EMPAGLIFLOZIN 10 MG PO TABS: 10.0000 mg | ORAL_TABLET | Freq: Every day | ORAL | 3 refills | Status: DC

## 2021-11-29 MED ORDER — SPIRONOLACTONE 25 MG PO TABS: 12.5000 mg | ORAL_TABLET | Freq: Every day | ORAL | 3 refills | Status: DC

## 2021-11-29 MED ORDER — SACUBITRIL-VALSARTAN 49-51 MG PO TABS
1.0000 | ORAL_TABLET | Freq: Two times a day (BID) | ORAL | 11 refills | Status: DC
Start: 2021-11-29 — End: 2023-05-29

## 2021-11-29 NOTE — Patient Instructions (Signed)
Stop taking diltiazem, telmesartan, and isosorbide  Start Entresto 49/51 mg twice a day  Start aldactone 12.5 mg daily  Start Jardiance 10 mg daily  Reduce nifedipine to 60 mg daily  We will arrange dopplers of your carotid arteries and renal arteries  We will check a blood test later this week  Follow up with our pharmacist in 3 weeks   Follow up with office visit in 2 months

## 2021-12-02 ENCOUNTER — Telehealth: Payer: Self-pay | Admitting: Cardiology

## 2021-12-02 NOTE — Telephone Encounter (Signed)
Patient is going to come pick up samples from [facility] per [place]

## 2021-12-02 NOTE — Telephone Encounter (Signed)
Spoke to patient she stated she needed a prior [place] for [place].Advised I will send message to our prior auth nurse.Advised I will leave Entresto 49/51 mg samples at [facility] office front desk.

## 2021-12-02 NOTE — Telephone Encounter (Signed)
*  STAT* If patient is at the pharmacy, call can be transferred to refill team.     1. Which medications need to be refilled? (please list name of each medication and dose if known) sacubitril-valsartan (ENTRESTO) 49-51 MG   2. Which pharmacy/location (including street and city if local pharmacy) is medication to be sent to? [facility] - [place], [place] - [address]   3. Do they need a 30 day or 90 day supply? 90  

## 2021-12-02 NOTE — Telephone Encounter (Signed)
*  STAT* If patient is at the pharmacy, call can be transferred to refill team.     1. Which medications need to be refilled? (please list name of each medication and dose if known) sacubitril-valsartan (ENTRESTO) 49-51 MG   2. Which pharmacy/location (including street and city if local pharmacy) is medication to be sent to? [facility] [address] [place] - [address]   3. Do they need a 30 day or 90 day supply? 90

## 2021-12-03 ENCOUNTER — Telehealth: Payer: Self-pay

## 2021-12-03 NOTE — Telephone Encounter (Signed)
**Note De-Identified Lynn Gallegos Obfuscation** Entresto PA started through covermymeds. Key: [id number]

## 2021-12-03 NOTE — Telephone Encounter (Signed)
**Note De-Identified Lynn Gallegos Obfuscation** Lynn Gallegos Key: [id number] - PA Case ID: [id number] Outcome: Approved today Approved quantity: 60 units per 30 day(s). The drug has been approved from 11/19/2021 to 12/03/2022.  Drug Entresto 49-51MG  tablets Form Lynn Gallegos Electronic Prior Authorization Request Form  I have notified [facility] - [place],  - [address] (Ph: [phone]) of this approval and I request that they fill the pts Entresto RX and to call her when ready for pick up.  I attempted to advise the pt Lynn Gallegos telephone but got no answer and her VM is full so I could not leave a message.

## 2021-12-03 NOTE — Telephone Encounter (Signed)
Received a fax from patient's insurance [employer] Lynn Gallegos has been approved.

## 2021-12-08 ENCOUNTER — Ambulatory Visit (HOSPITAL_COMMUNITY)
Admission: RE | Admit: 2021-12-08 | Discharge: 2021-12-08 | Disposition: A | Discharge status: Home / Self Care | Payer: Medicaid Other | Source: Ambulatory Visit | Attending: Internal Medicine | Admitting: Internal Medicine

## 2021-12-08 ENCOUNTER — Ambulatory Visit (HOSPITAL_BASED_OUTPATIENT_CLINIC_OR_DEPARTMENT_OTHER)
Admission: RE | Admit: 2021-12-08 | Discharge: 2021-12-08 | Disposition: A | Discharge status: Home / Self Care | Payer: Medicaid Other | Source: Ambulatory Visit | Attending: Internal Medicine | Admitting: Internal Medicine

## 2021-12-08 DIAGNOSIS — I10 Essential (primary) hypertension: Secondary | ICD-10-CM | POA: Insufficient documentation

## 2021-12-08 DIAGNOSIS — I25110 Atherosclerotic heart disease of native coronary artery with unstable angina pectoris: Secondary | ICD-10-CM | POA: Diagnosis not present

## 2021-12-08 DIAGNOSIS — I2542 Coronary artery dissection: Secondary | ICD-10-CM | POA: Insufficient documentation

## 2021-12-08 DIAGNOSIS — I25118 Atherosclerotic heart disease of native coronary artery with other forms of angina pectoris: Secondary | ICD-10-CM | POA: Insufficient documentation

## 2021-12-08 LAB — BASIC METABOLIC PANEL
BUN/Creatinine Ratio: 20 (ref 9–23)
BUN: 16 mg/dL (ref 6–20)
CO2: 24 mmol/L (ref 20–29)
Calcium: 9.5 mg/dL (ref 8.7–10.2)
Chloride: 102 mmol/L (ref 96–106)
Creatinine, Ser: 0.79 mg/dL (ref 0.57–1.00)
Glucose: 96 mg/dL (ref 70–99)
Potassium: 3.9 mmol/L (ref 3.5–5.2)
Sodium: 137 mmol/L (ref 134–144)
eGFR: 101 mL/min/{1.73_m2} (ref >59)

## 2021-12-09 ENCOUNTER — Other Ambulatory Visit (HOSPITAL_COMMUNITY): Payer: Self-pay | Admitting: *Deleted

## 2021-12-09 DIAGNOSIS — I214 Non-ST elevation (NSTEMI) myocardial infarction: Secondary | ICD-10-CM

## 2021-12-13 ENCOUNTER — Telehealth: Payer: Self-pay

## 2021-12-13 NOTE — Telephone Encounter (Signed)
Spoke to patient Dr.Jordan received a message from cardiac rehab you wanted to go to cardiac rehab at [facility].Stated she never said [employer] does not want to participate in cardiac rehab at this time.

## 2021-12-17 ENCOUNTER — Telehealth: Payer: Self-pay | Admitting: Cardiology

## 2021-12-17 MED ORDER — NIFEDIPINE ER 60 MG PO TB24: 60.0000 mg | ORAL_TABLET | Freq: Every day | ORAL | 11 refills | Status: DC

## 2021-12-17 MED ORDER — NEBIVOLOL HCL 20 MG PO TABS: 20.0000 mg | ORAL_TABLET | Freq: Every day | ORAL | 11 refills | Status: DC

## 2021-12-17 MED ORDER — ASPIRIN 81 MG PO TBEC: 81.0000 mg | DELAYED_RELEASE_TABLET | Freq: Every day | ORAL | 11 refills | Status: AC

## 2021-12-17 MED ORDER — CLOPIDOGREL BISULFATE 75 MG PO TABS: 75.0000 mg | ORAL_TABLET | Freq: Every day | ORAL | 11 refills | Status: AC

## 2021-12-17 NOTE — Telephone Encounter (Signed)
*  STAT* If patient is at the pharmacy, call can be transferred to refill team.   1. Which medications need to be refilled? (please list name of each medication and dose if known) need new prescriptions  Clopidogrel, Nifedipine, Nebivolol, Aspirin 81 mg  2. Which pharmacy/location (including street and city if local pharmacy) is medication to be sent to?[facility] [address] [place], [place]  3. Do they need a 30 day or 90 day supply? 30 days and refills

## 2021-12-20 ENCOUNTER — Ambulatory Visit: Payer: Medicaid Other

## 2021-12-20 ENCOUNTER — Telehealth: Payer: Self-pay | Admitting: Cardiology

## 2021-12-20 NOTE — Progress Notes (Deleted)
     12/20/2021 Lorin Glass 12/19/1985 [id number]   HPI:  Lynn Gallegos is a 36 y.o. female patient of Dr [place], with a PMH below who presents today for heart failure medication titration.  Lynn Gallegos delivered her second child by C-section in May, then was hospitalized in July with NSTEMI.  Saw Dr. [place] 3 weeks ago.   Stopped - diltiazem, isorobide mono  Started - Entresto 49/51, spironolactone 12.5 mg, Jardiance 10 mg qd  Continue nebivolol   Past Medical History: CAD 7/23 NSTEMI w/SCAD; DAPT x 1 year  hypertension Chronic w/superimposed pre-eclampsia  Gestational diabetes 7/23 Back to 4.9           Blood Pressure Goal:  130/80  Current Medications: Entresto 49/51 mg bid, nebivolol 20 mg qd, spironolactone 12.5 mg qd, empagliflozin 10 mg qd  Family Hx:  Social Hx:   Diet:   Exercise:   Home BP readings:   Intolerances: nkda  Labs: 12/08/21:  Na 137, K 3.9, Glu 96, BUN 16, SCr 0.79, GFR 101   Wt Readings from Last 3 Encounters:  11/29/21 263 lb (119.3 kg)  10/07/21 286 lb 9.6 oz (130 kg)  09/15/21 286 lb 9.6 oz (130 kg)   BP Readings from Last 3 Encounters:  11/29/21 118/72  10/07/21 133/69  09/20/21 (!) 145/69   Pulse Readings from Last 3 Encounters:  11/29/21 72  10/07/21 77  09/20/21 86    Current Outpatient Medications  Medication Sig Dispense Refill   aspirin EC 81 MG tablet Take 1 tablet (81 mg total) by mouth daily. 30 tablet 11   clopidogrel (PLAVIX) 75 MG tablet Take 1 tablet (75 mg total) by mouth daily. 30 tablet 11   empagliflozin (JARDIANCE) 10 MG TABS tablet Take 1 tablet (10 mg total) by mouth daily before breakfast. 90 tablet 3   Ferrous Sulfate (IRON PO) Take 1 tablet by mouth every morning.     Nebivolol HCl 20 MG TABS Take 1 tablet (20 mg total) by mouth daily. 30 tablet 11   NIFEdipine (ADALAT CC) 60 MG 24 hr tablet Take 1 tablet (60 mg total) by mouth daily. 30 tablet 11   Prenatal Vit-Fe Fumarate-FA (MULTIVITAMIN-PRENATAL) 27-0.8  MG TABS tablet Take 1 tablet by mouth daily at 12 noon.     sacubitril-valsartan (ENTRESTO) 49-51 MG Take 1 tablet by mouth 2 (two) times daily. 60 tablet 11   spironolactone (ALDACTONE) 25 MG tablet Take 0.5 tablets (12.5 mg total) by mouth daily. 45 tablet 3   No current facility-administered medications for this visit.    No Known Allergies  Past Medical History:  Diagnosis Date   Gestational diabetes    Hypertension     currently breastfeeding.  No problem-specific Assessment & Plan notes found for this encounter.   Phillips Hay PharmD CPP [facility] [address] [place], [place] [address] [phone]

## 2021-12-20 NOTE — Telephone Encounter (Signed)
Called patient back- she states she never received information for her PA.  Advised I would route to get this information.   Thanks!

## 2021-12-20 NOTE — Telephone Encounter (Signed)
Pt c/o medication issue:  1. Name of Medication: Nebivolol HCl 20 MG TABS  2. How are you currently taking this medication (dosage and times per day)? N/a  3. Are you having a reaction (difficulty breathing--STAT)? no  4. What is your medication issue? Patient was unable to pick up this medication this weekend. She was told that the prior authorization was faxed to the office but never filled out or faxed back. Please advise.

## 2021-12-21 NOTE — Telephone Encounter (Signed)
**Note De-Identified Lynn Gallegos Obfuscation** I started a urgent Nebivolol PA through covermymeds. KEY: [id number]

## 2021-12-22 NOTE — Telephone Encounter (Signed)
**Note De-Identified Levaughn Puccinelli Obfuscation** Lorin Glass Key: [id number] - PA Case ID: [id number] Outcome: Approved on August 8 Approved for NEBIVOLOL HCL Tablet 20MG , quantity up to 30 per 30 days, under the pharmacy benefit.  The drug has been approved from 12/21/2021 to 12/21/2022. Generic or biosimilar substitution may be required when available and preferred on the formulary. Drug: Nebivolol HCl 20MG  tablets Form [employer] Electronic Prior Authorization Request Form  I have notified the pt Jarvis [place] phone and [facility] - [place], - [place] [address] (Ph: [phone]) Cathleen Yagi VM of this approval.

## 2022-01-10 ENCOUNTER — Ambulatory Visit: Payer: Medicaid Other | Attending: Internal Medicine

## 2022-01-10 NOTE — Progress Notes (Deleted)
      01/10/2022 Lorin Glass 01-18-1986 [id number]   HPI:  Lynn Gallegos is a 36 y.o. female patient of Dr [place], with a PMH below who presents today for heart failure medication titration.    Echo showed EF at 40-45% - at last visit d/c dilt, telmisartan and imdur, dereased nifedipine to 60 d.; started Entresto 49/51, spiro 12.5 and empa 10  Past Medical History: CAD S/P NSTEMI on DAPT x 1 year  HTN Carotid and renal dopplers w/o significant blockage - treated with HF meds and nifedipine cc 60 mg  Gestational DM Delivered May 23  Pre-eclampsia Delivered May 4, 23 - C-section, not breastfeeding        Blood Pressure Goal:  130/80  Current Medications:  Entresto 49/51 bid, nebivolol 20 mg qd, empagliflozin 10 mg qd, spironolactone 12.5 mg qd   Family Hx:     Social Hx:      Diet:      Exercise:   Home BP readings:      Intolerances:  nkda  Labs: 12/08/21:  Na 137, K 3.9, Glu 96, BUN 16, SCr 0.79, GFR 101   Wt Readings from Last 3 Encounters:  11/29/21 263 lb (119.3 kg)  10/07/21 286 lb 9.6 oz (130 kg)  09/15/21 286 lb 9.6 oz (130 kg)   BP Readings from Last 3 Encounters:  11/29/21 118/72  10/07/21 133/69  09/20/21 (!) 145/69   Pulse Readings from Last 3 Encounters:  11/29/21 72  10/07/21 77  09/20/21 86    Current Outpatient Medications  Medication Sig Dispense Refill   aspirin EC 81 MG tablet Take 1 tablet (81 mg total) by mouth daily. 30 tablet 11   clopidogrel (PLAVIX) 75 MG tablet Take 1 tablet (75 mg total) by mouth daily. 30 tablet 11   empagliflozin (JARDIANCE) 10 MG TABS tablet Take 1 tablet (10 mg total) by mouth daily before breakfast. 90 tablet 3   Ferrous Sulfate (IRON PO) Take 1 tablet by mouth every morning.     Nebivolol HCl 20 MG TABS Take 1 tablet (20 mg total) by mouth daily. 30 tablet 11   NIFEdipine (ADALAT CC) 60 MG 24 hr tablet Take 1 tablet (60 mg total) by mouth daily. 30 tablet 11   Prenatal Vit-Fe Fumarate-FA  (MULTIVITAMIN-PRENATAL) 27-0.8 MG TABS tablet Take 1 tablet by mouth daily at 12 noon.     sacubitril-valsartan (ENTRESTO) 49-51 MG Take 1 tablet by mouth 2 (two) times daily. 60 tablet 11   spironolactone (ALDACTONE) 25 MG tablet Take 0.5 tablets (12.5 mg total) by mouth daily. 45 tablet 3   No current facility-administered medications for this visit.    No Known Allergies  Past Medical History:  Diagnosis Date   Gestational diabetes    Hypertension     currently breastfeeding.  No BP recorded.  {Refresh Note OR Click here to enter BP  :1}***    No problem-specific Assessment & Plan notes found for this encounter.   Phillips Hay PharmD CPP [facility] [address] [place], [place] [address] [phone]

## 2022-02-02 NOTE — Progress Notes (Signed)
Cardiology Office Note   Date:  02/07/2022   ID:  Lynn Gallegos, DOB 1986/03/31, MRN [id number]  PCP:  Oneita Hurt, No  Cardiologist:   Oluwafemi Villella [place], MD   Chief Complaint  Patient presents with   Coronary Artery Disease   Congestive Heart Failure      History of Present Illness: Lynn Gallegos is a 36 y.o. female who is seen at the request of Dr Earlene Plater for evaluation of SCAD. She has a history of HTN, gestational diabetes and obesity. She presented to [facility] on 11/14/21 with complaints of severe chest pain. Pain radiated to her right arm and wrist and was associated with SOB. She was severely hypertensive with BP 180/115. Ecg reportedly showed ST elevation in anterior leads. She was given ASA, sl Ntg and IV labetalol with reported resolution of Ecg changes and improvement in chest pain. Patient reports symptoms lasted 20- 30 minutes then resolved. Hgb was 10.8. normal BNP. Normal glucose. Troponin peaked at 14.3. She was treated with Plavix, diltiazem and transferred to [facility] for further evaluation. Chest CT was negative. Echo showed EF 40-45% with segmental WMA involving the mid and distal anteroseptal, apex and mid inferior wall. Mild LVH. Cardiac cath showed severe disease in the LAD and second diagonal c/w SCAD. She was treated medically and DC on ASA, Plavix. Bystolic 20 mg daily. Procardia XL 60 mg in am and 30 mg in pm, Diltiazem 120 mg daily, Imdur 30 mg daily and telmisartan 20 mg daily.   The patient delivered her second pregnancy on May 4. This was complicated by gestational diabetes and pre-eclampsia. She delivered by C section. She reports HTN since her early 60s. Mother died with a cerebral aneurysm in her 93s.  Patient had carotid and renal duplex which were normal.   On follow up today she is doing well. No chest pain, dyspnea, edema or palpitations. She did gain some weight doing desk work. Now walking 2 miles/day and no limitations.     Past Medical  History:  Diagnosis Date   Gestational diabetes    Hypertension     Past Surgical History:  Procedure Laterality Date   CESAREAN SECTION N/A 06/26/2014   Procedure: CESAREAN SECTION;  Surgeon: Brock Bad, MD;  Location: [facility] ORS;  Service: Obstetrics;  Laterality: N/A;   CESAREAN SECTION N/A 09/16/2021   Procedure: CESAREAN SECTION;  Surgeon: Huel Cote, MD;  Location: [facility] ORS;  Service: Obstetrics;  Laterality: N/A;   CESAREAN SECTION       Current Outpatient Medications  Medication Sig Dispense Refill   aspirin EC 81 MG tablet Take 1 tablet (81 mg total) by mouth daily. 30 tablet 11   clopidogrel (PLAVIX) 75 MG tablet Take 1 tablet (75 mg total) by mouth daily. 30 tablet 11   empagliflozin (JARDIANCE) 10 MG TABS tablet Take 1 tablet (10 mg total) by mouth daily before breakfast. 90 tablet 3   Nebivolol HCl 20 MG TABS Take 1 tablet (20 mg total) by mouth daily. 30 tablet 11   NIFEdipine (ADALAT CC) 60 MG 24 hr tablet Take 1 tablet (60 mg total) by mouth daily. 30 tablet 11   sacubitril-valsartan (ENTRESTO) 49-51 MG Take 1 tablet by mouth 2 (two) times daily. 60 tablet 11   spironolactone (ALDACTONE) 25 MG tablet Take 0.5 tablets (12.5 mg total) by mouth daily. 45 tablet 3   No current facility-administered medications for this visit.    Allergies:   Patient has no  known allergies.    Social History:  The patient  reports that she has never smoked. She has been exposed to tobacco smoke. She has never used smokeless tobacco. She reports that she does not drink alcohol and does not use drugs.   Family History:  The patient's family history includes Aneurysm (age of onset: 64) in her mother; Cancer in her paternal grandmother; Diabetes in her father; Hypertension in her maternal grandmother and mother.    ROS:  Please see the history of present illness.   Otherwise, review of systems are positive for none.   All other systems are reviewed and negative.    PHYSICAL  EXAM: VS:  BP 122/79   Pulse 76   Ht 5\' 10"  (1.778 m)   Wt 273 lb (123.8 kg)   SpO2 99%   BMI 39.17 kg/m  , BMI Body mass index is 39.17 kg/m. GEN: Well nourished, well developed, in no acute distress HEENT: normal Neck: no JVD, carotid bruits, or masses Cardiac: RRR; no murmurs, rubs, or gallops,no edema  Respiratory:  clear to auscultation bilaterally, normal work of breathing GI: soft, nontender, nondistended, + BS MS: no deformity or atrophy Skin: warm and dry, no rash Neuro:  Strength and sensation are intact Psych: euthymic mood, full affect   EKG:  EKG is not ordered today.     Recent Labs: 09/17/2021: Magnesium 4.6 10/07/2021: ALT 51; Hemoglobin 11.2; Platelets 315 12/08/2021: BUN 16; Creatinine, Ser 0.79; Potassium 3.9; Sodium 137   Dated 11/04/21: Hgb 11.7. CMET normal. Dated 11/14/21: cholesterol 134, triglycerides 118, LDL 65, HDL 45.   Lipid Panel No results found for: "CHOL", "TRIG", "HDL", "CHOLHDL", "VLDL", "LDLCALC", "LDLDIRECT"    Wt Readings from Last 3 Encounters:  02/07/22 273 lb (123.8 kg)  11/29/21 263 lb (119.3 kg)  10/07/21 286 lb 9.6 oz (130 kg)      Other studies Reviewed: Additional studies/ records that were reviewed today include:   Echo 11/14/21: Summary    Left Ventricle: Segmental wall motion abnormality present. The ejection fraction is mildly decreased. The estimated ejection fraction is 40-45%. Diastolic function is normal.   Tricuspid Valve: There is no evidence of pulmonary hypertension.  Hyperkinesis Not evaluated Normal wall motion  Moderate hypokinesis Akinesis Dyskinesis  Aneurysm Mild hypokinesis Severe hypokinesis Narrative  This result has an attachment that is not available.   Vital Signs  Height: 1.77 m. Weight: 122.0 kg. BSA: 2.45 m. BP: 144/93 mm Hg.  Heart rate: 68 bpm.   Procedure  Study quality: Technically difficult due to body habitus. Study type: 2D, m-mode and color flow Doppler study was performed.  Status: stat.  Technologist:  Judeth Horn.    Reason for exam:  Myocardial Infarction.  2D  LVIDd 42 mm      (38-52) FS 36.1 %  LA diam systole 34 mm  LVIDd I 17.15 mm/m  (23.00-31.00) IVSd 13 mm  (6-9) LA diam systole Index 13.92 mm/m  LVIDs 27 mm      (22-35) LVPWd 13 mm  (6-9)  TAPSE 18 mm      (>=17)  LV Mass-c 208 g  (67-162)  LV MassI 85.0 g/m      (43.0-95.0)  LVOT diam 21 mm  Ao diam diastole 34 mm   Doppler  TR Vmax 2.25 m/s  AV VTI 21.8 cm  MV PGmax 2.30 mm Hg  TR PGmax 20.33 mm Hg  AVAI (Vmax) 0.86 cm/m  MV PGmean 1.05 mm Hg  RVOT PGmax 2.31 mm  Hg  AVA (VTI) 2.08 cm  (3.00-4.00) MVA (PHT) 3.94 cm  RVSP 23.33 mm Hg  AV Vmax 1.02 m/s  RAP 3.00 mm Hg  AV PGmax 4.13 mm Hg  PV Vmax 1.02 m/s  AV PGmean 2.53 mm Hg  PV PGmax 4.13 mm Hg  AVA (Vmax) 2.10 cm  (3.00-4.00)  LVOT Vmax 0.64 m/s  AVAI (VTI) 0.85 cm/m  LVOT PGmax 1.62 mm Hg  LVOT VTI/AV VTI 0.62  LVOT VTI 13.5 cm   Diastolic Function  MV E Vel 0.52 m/s  E'(l) 4.65 cm/s  (14.00-25.60)  MV A Vel 0.67 m/s  E'(s) 5.72 cm/s  (10.10-20.90)  MV E/A 0.77      (0.73-2.33) E/E'(l) 11.20  MV Dec. Time 192 ms      (138-194) E/E'(s) 9.10  E/E' mean 10.04   Left Ventricle  The left ventricle cavity size is normal. Segmental wall motion abnormality present. The ejection fraction is mildly decreased. The estimated ejection fraction is 40-45%. The diastolic filling pattern is normal. Severe hypokinesis: mid anterior septal, mid inferior septal, apical septal, apex. Normal wall motion: all remaining segments. There is mild concentric LV hypertrophy visualized.   Left Atrium  The left atrium cavity size is normal.   Right Atrium  The right atrium cavity size is normal.   Right Ventricle  The right ventricle cavity size is normal. Normal systolic function is visualized.   Aortic Valve  Normal aortic valve structure with no stenosis or regurgitation.   Mitral Valve  Normal mitral valve structure with no stenosis or  regurgitation.   Tricuspid Valve  Normal Tricuspid valve structure with no stenosis or regurgitation. The pulmonary pressure is normal. There is no evidence of pulmonary hypertension. Trace amount of tricuspid regurgitation is visualized.  RVSP 23.33 mm Hg  RAP 3.00 mm Hg  TR Vmax 2.25 m/s   Pulmonic Valve  Normal pulmonic valve structure with no stenosis or regurgitation.   Pericardium  The pericardium is normal.   Aorta  here is a normal size of aorta, normal ascending aorta and aortic arch.   Pulmonary Artery  Normal pulmonary artery with no dilatation.   IVC / Hepatic Veins  The inferior vena cava is normal in size with respiratory change > 50 %. 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  Diffuse, severe narrowing.    Patient had transient ST segment elevation 2 days prior to cardiac  catheterization.  Symptoms and ST segment elevation have resolved without  recurrence on medical therapy.  Echocardiogram demonstrated anterior  apical and septal hypokinesis with ejection fraction 40-45%.      RECOMMENDATIONS:   Recommend intensive medical therapy with in-hospital observation 3-5 days  prior to discharge.  Reserve percutaneous coronary intervention for  recurrent, refractory angina given the high risk of complications in the  setting SCAD. Narrative  This result has an attachment that is not available.  Images from the original result were not included.     Dist LAD lesion is 90% stenosed.    3rd Diag lesion is 90% stenosed.   Coronary Findings Diagnostic Dominance: Right  Left Anterior Descending: Dist LAD lesion is 90% stenosed. Third Diagonal Branch: 3rd Diag lesion is 90% stenosed.  First Obtuse Marginal Branch: The vessel is small in caliber.   Intervention  No interventions have been  documented. Performed Procedures  LEFT  HEART CATH WITH CORONARY ANGIOGRAPHY Exam End: 11/15/21 10:57 Last Resulted: 11/17/21 21:34  Received From: [facility]    ASSESSMENT AND PLAN:  1.  S/p NSTEMI with SCAD involving the mid to distal LAD and second diagonal. I have personally reviewed her cardiac cath films and agree with findings. She is asymptomatic. Recommend continue DAPT for one year. She is on optimal CHF therapy. Normal carotid and renal duplex exams.  2. Chronic systolic CHF. EF 40-45%. Need to optimize medical therapy. She is on optimal medical therapy.  Plan to repeat Echo now.  3. HTN longstanding. Normal renal duplex. BP control is excellent. 4. Gestational DM 5. Pre-eclampsia. Patient is not planning on having more children. Has taken steps to make sure this doesn't happen. Understands that any future pregnancy would be high risk.    Current medicines are reviewed at length with the patient today.  The patient does not have concerns regarding medicines.  The following changes have been made:  see above  Labs/ tests ordered today include:   No orders of the defined types were placed in this encounter.        Disposition:   FU 6 months  Signed, Shiloh Swopes Swazil[place]Jordan, MD  02/07/2022 8:44 AM    [facility] [address], [place], [place], [id number] Phone [phone], Fax [phone]

## 2022-02-07 ENCOUNTER — Encounter: Payer: Self-pay | Admitting: Cardiology

## 2022-02-07 ENCOUNTER — Ambulatory Visit: Payer: Medicaid Other | Attending: Cardiology | Admitting: Cardiology

## 2022-02-07 VITALS — BP 122/79 | HR 76 | Ht 70.0 in | Wt 273.0 lb

## 2022-02-07 DIAGNOSIS — I10 Essential (primary) hypertension: Secondary | ICD-10-CM

## 2022-02-07 DIAGNOSIS — I5022 Chronic systolic (congestive) heart failure: Secondary | ICD-10-CM

## 2022-02-07 DIAGNOSIS — I25110 Atherosclerotic heart disease of native coronary artery with unstable angina pectoris: Secondary | ICD-10-CM | POA: Diagnosis not present

## 2022-02-07 DIAGNOSIS — I2542 Coronary artery dissection: Secondary | ICD-10-CM

## 2022-02-07 NOTE — Patient Instructions (Signed)
Medication Instructions:  Continue same medications *If you need a refill on your cardiac medications before your next appointment, please call your pharmacy*   Lab Work: None ordered   Testing/Procedures: Echo   Follow-Up: At [facility], you and your health needs are our priority.  As part of our continuing mission to provide you with exceptional heart care, we have created designated Provider Care Teams.  These Care Teams include your primary Cardiologist (physician) and Advanced Practice Providers (APPs -  Physician Assistants and Nurse Practitioners) who all work together to provide you with the care you need, when you need it.  We recommend signing up for the patient portal called "MyChart".  Sign up information is provided on this After Visit Summary.  MyChart is used to connect with patients for Virtual Visits (Telemedicine).  Patients are able to view lab/test results, encounter notes, upcoming appointments, etc.  Non-urgent messages can be sent to your provider as well.   To learn more about what you can do with MyChart, go to [email or web address].    Your next appointment:  6 months   Call in Nov to schedule March appointment     The format for your next appointment: Office     Provider: Dr.Jordan    Important Information About Sugar

## 2022-02-18 ENCOUNTER — Ambulatory Visit (INDEPENDENT_AMBULATORY_CARE_PROVIDER_SITE_OTHER): Payer: Medicaid Other

## 2022-02-18 DIAGNOSIS — I25110 Atherosclerotic heart disease of native coronary artery with unstable angina pectoris: Secondary | ICD-10-CM

## 2022-02-18 DIAGNOSIS — I2542 Coronary artery dissection: Secondary | ICD-10-CM | POA: Diagnosis not present

## 2022-02-18 DIAGNOSIS — I10 Essential (primary) hypertension: Secondary | ICD-10-CM | POA: Diagnosis not present

## 2022-02-18 DIAGNOSIS — I3139 Other pericardial effusion (noninflammatory): Secondary | ICD-10-CM

## 2022-02-18 DIAGNOSIS — I5022 Chronic systolic (congestive) heart failure: Secondary | ICD-10-CM | POA: Diagnosis not present

## 2022-02-18 DIAGNOSIS — I5030 Unspecified diastolic (congestive) heart failure: Secondary | ICD-10-CM

## 2022-02-18 DIAGNOSIS — I517 Cardiomegaly: Secondary | ICD-10-CM | POA: Diagnosis not present

## 2022-02-18 LAB — ECHOCARDIOGRAM COMPLETE
AR max vel: 2.75 cm2
AV Area VTI: 2.8 cm2
AV Area mean vel: 2.97 cm2
AV Mean grad: 4 mmHg
AV Peak grad: 7.4 mmHg
Ao pk vel: 1.36 m/s
Area-P 1/2: 5.06 cm2
Calc EF: 71.7 %
S' Lateral: 2.09 cm
Single Plane A2C EF: 68.2 %
Single Plane A4C EF: 73.9 %

## 2022-05-04 ENCOUNTER — Other Ambulatory Visit: Payer: Self-pay | Admitting: Obstetrics and Gynecology

## 2022-05-04 DIAGNOSIS — N6320 Unspecified lump in the left breast, unspecified quadrant: Secondary | ICD-10-CM

## 2022-05-19 ENCOUNTER — Other Ambulatory Visit: Payer: Medicaid Other

## 2022-05-27 ENCOUNTER — Ambulatory Visit
Admission: RE | Admit: 2022-05-27 | Discharge: 2022-05-27 | Disposition: A | Discharge status: Home / Self Care | Payer: Medicaid Other | Source: Ambulatory Visit | Attending: Obstetrics and Gynecology | Admitting: Obstetrics and Gynecology

## 2022-05-27 DIAGNOSIS — N6320 Unspecified lump in the left breast, unspecified quadrant: Secondary | ICD-10-CM

## 2022-08-06 NOTE — Progress Notes (Unsigned)
Cardiology Office Note   Date:  08/10/2022   ID:  DEZIYAH Gallegos, DOB 1985-09-23, MRN [id number]  PCP:  Merryl Hacker, No  Cardiologist:   Jamie-Lee Galdamez Martinique, MD   Chief Complaint  Patient presents with   Coronary Artery Disease      History of Present Illness: Lynn Gallegos is a 37 y.o. 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 She has a history of HTN, gestational diabetes and obesity. She presented to [facility] on 11/14/21 with complaints of severe chest pain. Pain radiated to her right arm and wrist and was associated with SOB. She was severely hypertensive with BP 180/115. Ecg reportedly showed ST elevation in anterior leads. She was given ASA, sl Ntg and IV labetalol with reported resolution of Ecg changes and improvement in chest pain. Patient reports symptoms lasted 20- 30 minutes then resolved. Hgb was 10.8. normal BNP. Normal glucose. Troponin peaked at 14.3. She was treated with Plavix, diltiazem and transferred to [facility] for further evaluation. Chest CT was negative. Echo showed EF 40-45% with segmental WMA involving the mid and distal anteroseptal, apex and mid inferior wall. Mild LVH. Cardiac cath showed severe disease in the LAD and second diagonal c/w SCAD. She was treated medically and DC on ASA, Plavix. Bystolic 20 mg daily. Procardia XL 60 mg in am and 30 mg in pm, Diltiazem 120 mg daily, Imdur 30 mg daily and telmisartan 20 mg daily.   The patient delivered her second pregnancy on May 4. This was complicated by gestational diabetes and pre-eclampsia. She delivered by C section. She reports HTN since her early 67s. Mother died with a cerebral aneurysm in her 3s.  Patient had carotid and renal duplex which were normal.   Echo in October 2023 showed normalization of LV function.  On follow up today she is doing well. No chest pain, dyspnea, edema or palpitations. 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 Has gained weight. Notes she has had  difficult to control BP since her 7s but now well controlled. Is not planning on having more children.    Past Medical History:  Diagnosis Date   Gestational diabetes    Hypertension     Past Surgical History:  Procedure Laterality Date   CESAREAN SECTION N/A 06/26/2014   Procedure: CESAREAN SECTION;  Surgeon: Shelly Bombard, MD;  Location: [facility];  Service: Obstetrics;  Laterality: N/A;   CESAREAN SECTION N/A 09/16/2021   Procedure: CESAREAN SECTION;  Surgeon: Paula Compton, MD;  Location: [facility];  Service: Obstetrics;  Laterality: N/A;   CESAREAN SECTION       Current Outpatient Medications  Medication Sig Dispense Refill   aspirin EC 81 MG tablet Take 1 tablet (81 mg total) by mouth daily. 30 tablet 11   clopidogrel (PLAVIX) 75 MG tablet Take 1 tablet (75 mg total) by mouth daily. 30 tablet 11   empagliflozin (JARDIANCE) 10 MG TABS tablet Take 1 tablet (10 mg total) by mouth daily before breakfast. 90 tablet 3   Nebivolol HCl 20 MG TABS Take 1 tablet (20 mg total) by mouth daily. 30 tablet 11   NIFEdipine (ADALAT CC) 60 MG 24 hr tablet Take 1 tablet (60 mg total) by mouth daily. 30 tablet 11   sacubitril-valsartan (ENTRESTO) 49-51 MG Take 1 tablet by mouth 2 (two) times daily. 60 tablet 11   spironolactone (ALDACTONE) 25 MG tablet Take 0.5 tablets (12.5 mg total) by mouth daily.  45 tablet 3   No current facility-administered medications for this visit.    Allergies:   Patient has no known allergies.    Social History:  The patient  reports that she has never smoked. She has been exposed to tobacco smoke. She has never used smokeless tobacco. She reports that she does not drink alcohol and does not use drugs.   Family History:  The patient's family history includes Aneurysm (age of onset: 52) in her mother; Cancer in her paternal grandmother; Diabetes in her father; Hypertension in her maternal grandmother and mother.    ROS:  Please see the history of present  illness.   Otherwise, review of systems are positive for none.   All other systems are reviewed and negative.    PHYSICAL EXAM: VS:  BP 112/75   Pulse 75   Ht 5\' 10"  (1.778 m)   Wt 288 lb 9.6 oz (130.9 kg)   SpO2 99%   BMI 41.41 kg/m  , BMI Body mass index is 41.41 kg/m. GEN: Well nourished, well developed, in no acute distress HEENT: normal Neck: no JVD, carotid bruits, or masses Cardiac: RRR; no murmurs, rubs, or gallops,no edema  Respiratory:  clear to auscultation bilaterally, normal work of breathing GI: soft, nontender, nondistended, + BS MS: no deformity or atrophy Skin: warm and dry, no rash Neuro:  Strength and sensation are intact Psych: euthymic mood, full affect   EKG:  EKG is not ordered today.     Recent Labs: 09/17/2021: Magnesium 4.6 10/07/2021: ALT 51; Hemoglobin 11.2; Platelets 315 12/08/2021: BUN 16; Creatinine, Ser 0.79; Potassium 3.9; Sodium 137   Dated 11/04/21: Hgb 11.7. CMET normal. Dated 11/14/21: cholesterol 134, triglycerides 118, LDL 65, HDL 45.   Lipid Panel No results found for: "CHOL", "TRIG", "HDL", "CHOLHDL", "VLDL", "LDLCALC", "LDLDIRECT"    Wt Readings from Last 3 Encounters:  08/10/22 288 lb 9.6 oz (130.9 kg)  02/07/22 273 lb (123.8 kg)  11/29/21 263 lb (119.3 kg)      Other studies Reviewed: Additional studies/ records that were reviewed today include:   Echo 11/14/21: Summary    Left Ventricle: Segmental wall motion abnormality present. The ejection fraction is mildly decreased. The estimated ejection fraction is 40-45%. Diastolic function is normal.   Tricuspid Valve: There is no evidence of pulmonary hypertension.  Hyperkinesis Not evaluated Normal wall motion  Moderate hypokinesis Akinesis Dyskinesis  Aneurysm Mild hypokinesis Severe hypokinesis Narrative  This result has an attachment that is not available.   Vital Signs  Height: 1.77 m. Weight: 122.0 kg. BSA: 2.45 m. BP: 144/93 mm Hg.  Heart rate: 68 bpm.   Procedure   Study quality: Technically difficult due to body habitus. Study type: 2D, m-mode and color flow Doppler study was performed. Status: stat.  Technologist:  Judeth Horn.    Reason for exam:  Myocardial Infarction.  2D  LVIDd 42 mm      (38-52) FS 36.1 %  LA diam systole 34 mm  LVIDd I 17.15 mm/m  (23.00-31.00) IVSd 13 mm  (6-9) LA diam systole Index 13.92 mm/m  LVIDs 27 mm      (22-35) LVPWd 13 mm  (6-9)  TAPSE 18 mm      (>=17)  LV Mass-c 208 g  (67-162)  LV MassI 85.0 g/m      (43.0-95.0)  LVOT diam 21 mm  Ao diam diastole 34 mm   Doppler  TR Vmax 2.25 m/s  AV VTI 21.8 cm  MV PGmax 2.30 mm  Hg  TR PGmax 20.33 mm Hg  AVAI (Vmax) 0.86 cm/m  MV PGmean 1.05 mm Hg  RVOT PGmax 2.31 mm Hg  AVA (VTI) 2.08 cm  (3.00-4.00) MVA (PHT) 3.94 cm  RVSP 23.33 mm Hg  AV Vmax 1.02 m/s  RAP 3.00 mm Hg  AV PGmax 4.13 mm Hg  PV Vmax 1.02 m/s  AV PGmean 2.53 mm Hg  PV PGmax 4.13 mm Hg  AVA (Vmax) 2.10 cm  (3.00-4.00)  LVOT Vmax 0.64 m/s  AVAI (VTI) 0.85 cm/m  LVOT PGmax 1.62 mm Hg  LVOT VTI/AV VTI 0.62  LVOT VTI [id number] cm   Diastolic Function  MV E Vel 0.52 m/s  E'(l) 4.65 cm/s  (14.00-25.60)  MV A Vel 0.67 m/s  E'(s) 5.72 cm/s  (10.10-20.90)  MV E/A 0.77      (0.73-2.33) E/E'(l) 11.20  MV Dec. Time 192 ms      (138-194) E/E'(s) 9.10  E/E' mean 10.04   Left Ventricle  The left ventricle cavity size is normal. Segmental wall motion abnormality present. The ejection fraction is mildly decreased. The estimated ejection fraction is 40-45%. The diastolic filling pattern is normal. Severe hypokinesis: mid anterior septal, mid inferior septal, apical septal, apex. Normal wall motion: all remaining segments. There is mild concentric LV hypertrophy visualized.   Left Atrium  The left atrium cavity size is normal.   Right Atrium  The right atrium cavity size is normal.   Right Ventricle  The right ventricle cavity size is normal. Normal systolic function is visualized.   Aortic Valve  Normal aortic valve  structure with no stenosis or regurgitation.   Mitral Valve  Normal mitral valve structure with no stenosis or regurgitation.   Tricuspid Valve  Normal Tricuspid valve structure with no stenosis or regurgitation. The pulmonary pressure is normal. There is no evidence of pulmonary hypertension. Trace amount of tricuspid regurgitation is visualized.  RVSP 23.33 mm Hg  RAP 3.00 mm Hg  TR Vmax 2.25 m/s   Pulmonic Valve  Normal pulmonic valve structure with no stenosis or regurgitation.   Pericardium  The pericardium is normal.   Aorta  here is a normal size of aorta, normal ascending aorta and aortic arch.   Pulmonary Artery  Normal pulmonary artery with no dilatation.   IVC / Hepatic Veins  The inferior vena cava is normal in size with respiratory change > 50 %. 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  Diffuse, severe narrowing.    Patient had transient ST segment elevation 2 days prior to cardiac  catheterization.  Symptoms and ST segment elevation have resolved without  recurrence on medical therapy.  Echocardiogram demonstrated anterior  apical and septal hypokinesis with ejection fraction 40-45%.      RECOMMENDATIONS:   Recommend intensive medical therapy with in-hospital observation 3-5 days  prior to discharge.  Reserve percutaneous coronary intervention for  recurrent, refractory angina given the high risk of complications in the  setting SCAD. Narrative  This result has an attachment that is not available.  Images from the original result were not included.     Dist LAD lesion is 90% stenosed.    3rd Diag lesion is 90% stenosed.   Coronary Findings Diagnostic Dominance: Right  Left Anterior Descending: Dist LAD lesion is 90% stenosed. Third Diagonal Branch: 3rd Diag lesion is 90% stenosed.  First  Obtuse Marginal Branch: The vessel is small in caliber.   Intervention  No interventions have been documented. Performed Procedures  LEFT HEART CATH WITH CORONARY ANGIOGRAPHY Exam End: 11/15/21 10:57 Last Resulted: 11/17/21 21:34  Received From: [facility]     Echo 02/18/22: IMPRESSIONS     1. Left ventricular ejection fraction, by estimation, is 60 to 65%. The  left ventricle has normal function. The left ventricle has no regional  wall motion abnormalities. There is moderate left ventricular hypertrophy.  Left ventricular diastolic  parameters are consistent with Grade I diastolic dysfunction (impaired  relaxation).   2. Right ventricular systolic function is normal. The right ventricular  size is normal. There is normal pulmonary artery systolic pressure.   3. A small pericardial effusion is present. There is no evidence of  cardiac tamponade.   4. The mitral valve is normal in structure. No evidence of mitral valve  regurgitation.   5. The aortic valve is tricuspid. Aortic valve regurgitation is not  visualized.   6. The inferior vena cava is normal in size with greater than 50%  respiratory variability, suggesting right atrial pressure of 3 mmHg.   Comparison(s): Outside Echo 11/14/2021 EF 40 %.   ASSESSMENT AND PLAN:  1.  S/p NSTEMI with SCAD involving the mid to distal LAD and second diagonal. I have personally reviewed her cardiac cath films and agree with findings. She is asymptomatic. Recommend continue DAPT for one year. May then stop Plavix.  She is on optimal CHF therapy. Normal carotid and renal duplex exams. Fortunately LV function returned to normal.  2. Chronic systolic CHF. EF 40-45%. She is on optimal medical therapy.  Echo in October showed normal LV function. Discussed potential to reduce medication but actually very happy with BP control currently.  3. HTN longstanding. Normal renal duplex. BP control is excellent. 4. Gestational DM 5.  Pre-eclampsia. Patient is not planning on having more children.     Current medicines are reviewed at length with the patient today.  The patient does not have concerns regarding medicines.  The following changes have been made:  see above  Labs/ tests ordered today include:   No orders of the defined types were placed in this encounter.        Disposition:   FU 6 months  Signed, Aman Batley Martinique, MD  08/10/2022 11:43 AM    [facility] [address], [place], [place], [address] Phone [phone], Fax [phone]

## 2022-08-10 ENCOUNTER — Encounter: Payer: Self-pay | Admitting: Cardiology

## 2022-08-10 ENCOUNTER — Ambulatory Visit: Payer: Medicaid Other | Attending: Cardiology | Admitting: Cardiology

## 2022-08-10 VITALS — BP 112/75 | HR 75 | Ht 70.0 in | Wt 288.6 lb

## 2022-08-10 DIAGNOSIS — I5022 Chronic systolic (congestive) heart failure: Secondary | ICD-10-CM | POA: Diagnosis not present

## 2022-08-10 DIAGNOSIS — I2542 Coronary artery dissection: Secondary | ICD-10-CM

## 2022-08-10 DIAGNOSIS — I10 Essential (primary) hypertension: Secondary | ICD-10-CM | POA: Diagnosis not present

## 2022-08-10 DIAGNOSIS — I25110 Atherosclerotic heart disease of native coronary artery with unstable angina pectoris: Secondary | ICD-10-CM

## 2022-08-10 NOTE — Patient Instructions (Signed)
Medication Instructions:  Continue same medications *If you need a refill on your cardiac medications before your next appointment, please call your pharmacy*   Lab Work: None ordered   Testing/Procedures: None ordered   Follow-Up: At [facility], you and your health needs are our priority.  As part of our continuing mission to provide you with exceptional heart care, we have created designated Provider Care Teams.  These Care Teams include your primary Cardiologist (physician) and Advanced Practice Providers (APPs -  Physician Assistants and Nurse Practitioners) who all work together to provide you with the care you need, when you need it.  We recommend signing up for the patient portal called "MyChart".  Sign up information is provided on this After Visit Summary.  MyChart is used to connect with patients for Virtual Visits (Telemedicine).  Patients are able to view lab/test results, encounter notes, upcoming appointments, etc.  Non-urgent messages can be sent to your provider as well.   To learn more about what you can do with MyChart, go to [email or web address].    Your next appointment:  6 months    Provider:  Dr.Jordan's PA

## 2023-02-06 NOTE — Progress Notes (Deleted)
Cardiology Clinic Note   Patient Name: Lynn Gallegos Date of Encounter: 02/06/2023  Primary Care Provider:  Pcp, No Primary Cardiologist:  Peter Swaziland, MD  Patient Profile    37 y.o. 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 She has a history of HTN, gestational diabetes and obesity. She presented to [facility] on 11/14/21 with complaints of severe chest pain. Pain radiated to her right arm and wrist and was associated with SOB. She was severely hypertensive with BP 180/115. Ecg reportedly showed ST elevation in anterior leads. She was given ASA, sl Ntg and IV labetalol with reported resolution of Ecg changes and improvement in chest pain.   Echo showed EF 40-45% with segmental WMA involving the mid and distal anteroseptal, apex and mid inferior wall. Mild LVH. Cardiac cath showed severe disease in the LAD and second diagonal c/w SCAD. She was treated medically and DC on ASA, Plavix. Bystolic 20 mg daily. Procardia XL 60 mg in am and 30 mg in pm, Diltiazem 120 mg daily, Imdur 30 mg daily and telmisartan 20 mg daily. Patient had carotid and renal duplex which were normal.   Last seen by Dr. Swaziland on 08/10/2022 and was doing well up.  Blood pressure was well-controlled.  She did not planning on having anymore children due to gestational diabetes and worsening health status.  Most recent echocardiogram on 11/14/2021 EF of 40 to 45%.  With repeat echocardiogram 02/18/2022 with normalization of EF to 60 to 65%.  May stop clopidogrel on this office visit if she is asymptomatic.  Past Medical History    Past Medical History:  Diagnosis Date   Gestational diabetes    Hypertension    Past Surgical History:  Procedure Laterality Date   CESAREAN SECTION N/A 06/26/2014   Procedure: CESAREAN SECTION;  Surgeon: Brock Bad, MD;  Location: [facility];  Service: Obstetrics;  Laterality: N/A;   CESAREAN SECTION N/A 09/16/2021   Procedure: CESAREAN SECTION;  Surgeon: Huel Cote,  MD;  Location: [facility];  Service: Obstetrics;  Laterality: N/A;   CESAREAN SECTION      Allergies  No Known Allergies  History of Present Illness    ***  Home Medications    Current Outpatient Medications  Medication Sig Dispense Refill   empagliflozin (JARDIANCE) 10 MG TABS tablet Take 1 tablet (10 mg total) by mouth daily before breakfast. 90 tablet 3   Nebivolol HCl 20 MG TABS Take 1 tablet (20 mg total) by mouth daily. 30 tablet 11   NIFEdipine (ADALAT CC) 60 MG 24 hr tablet Take 1 tablet (60 mg total) by mouth daily. 30 tablet 11   sacubitril-valsartan (ENTRESTO) 49-51 MG Take 1 tablet by mouth 2 (two) times daily. 60 tablet 11   spironolactone (ALDACTONE) 25 MG tablet Take 0.5 tablets (12.5 mg total) by mouth daily. 45 tablet 3   No current facility-administered medications for this visit.     Family History    Family History  Problem Relation Age of Onset   Hypertension Mother    Aneurysm Mother 59       cerebral aneurysm   Diabetes Father    Hypertension Maternal Grandmother    Cancer Paternal Grandmother    She indicated that her mother is deceased. She indicated that her father is alive. She indicated that her brother is alive. She indicated that the status of her maternal grandmother is unknown. She indicated that her paternal grandmother is deceased.  Social History  Social History   Socioeconomic History   Marital status: Single    Spouse name: Not on file   Number of children: Not on file   Years of education: Not on file   Highest education level: Not on file  Occupational History   Not on file  Tobacco Use   Smoking status: Never    Passive exposure: Yes   Smokeless tobacco: Never  Vaping Use   Vaping status: Never Used  Substance and Sexual Activity   Alcohol use: No    Alcohol/week: 0.0 standard drinks of alcohol   Drug use: No   Sexual activity: Yes    Partners: Male    Birth control/protection: Condom  Other Topics Concern   Not  on file  Social History Narrative   Works as Film/video editor Strain: Low Risk  (11/14/2021)   Received from [facility] [place], [facility] [employer] (CARDIA)    Difficulty of Paying Living Expenses: Not hard at all  Food Insecurity: No Food Insecurity (11/14/2021)   Received from [facility] [place], [facility] [employer] Vital Sign    Worried About Programme researcher, broadcasting/film/video in the Last Year: Never true    Ran Out of Food in the Last Year: Never true  Transportation Needs: No Transportation Needs (11/14/2021)   Received from [facility] [place], [facility] [employer] - Administrator, Civil Service (Medical): No    Lack of Transportation (Non-Medical): No  Physical Activity: Insufficiently Active (10/29/2020)   Received [facility] [place], [facility] [place]   Exercise Vital Sign    Days of Exercise per Week: 5 days    Minutes of Exercise per Session: 20 min  Stress: No Stress Concern Present (10/29/2020)   Received from [facility] [place], [facility] [employer] of Occupational Health - Occupational Stress Questionnaire    Feeling of Stress : Only a little  Social Connections: Moderately Isolated (10/29/2020)   Received from [facility] [place], [facility] [employer] and Isolation Panel [NHANES]    Frequency of Communication with Friends and Family: More than three times a week    Frequency of Social Gatherings with Friends and Family: Once a week    Attends Religious Services: More than 4 times per year    Active Member of [employer] or Organizations: No    Attends Banker Meetings: Never    Marital Status: Never married  Intimate Partner Violence: Not At Risk (11/14/2021)   Received from [facility] [place], [facility], Afraid, Rape, and Kick questionnaire    Fear of Current or Ex-Partner: No    Emotionally Abused: No    Physically Abused: No    Sexually Abused: No     Review of Systems    General:  No chills, fever, night sweats or weight changes.  Cardiovascular:  No chest pain, dyspnea on exertion, edema, orthopnea, palpitations, paroxysmal nocturnal dyspnea. Dermatological: No rash, lesions/masses Respiratory: No cough, dyspnea Urologic: No hematuria, dysuria Abdominal:   No nausea, vomiting, diarrhea, bright red blood per rectum, melena, or hematemesis Neurologic:  No visual changes, wkns, changes in mental status. All other systems reviewed and are otherwise negative except as noted above.       Physical  Exam    VS:  There were no vitals taken for this visit. , BMI There is no height or weight on file to calculate BMI.     GEN: Well nourished, well developed, in no acute distress. HEENT: normal. Neck: Supple, no JVD, carotid bruits, or masses. Cardiac: RRR, no murmurs, rubs, or gallops. No clubbing, cyanosis, edema.  Radials/DP/PT 2+ and equal bilaterally.  Respiratory:  Respirations regular and unlabored, clear to auscultation bilaterally. GI: Soft, nontender, nondistended, BS + x 4. MS: no deformity or atrophy. Skin: warm and dry, no rash. Neuro:  Strength and sensation are intact. Psych: Normal affect.      Lab Results  Component Value Date   WBC 11.9 (H) 10/07/2021   HGB 11.2 (L) 10/07/2021   HCT 34.4 (L) 10/07/2021   MCV 81.3 10/07/2021   PLT 315 10/07/2021   Lab Results  Component Value Date   CREATININE 0.79 12/08/2021   BUN 16 12/08/2021   NA 137 12/08/2021   K 3.9 12/08/2021   CL 102 12/08/2021   CO2 24 12/08/2021   Lab Results  Component Value Date   ALT 51 (H) 10/07/2021   AST 79 (H) 10/07/2021   ALKPHOS 220 (H) 10/07/2021   BILITOT 0.8 10/07/2021   No results found for: "CHOL", "HDL", "LDLCALC", "LDLDIRECT", "TRIG",  "CHOLHDL"  No results found for: "HGBA1C"   Review of Prior Studies    Echo 02/18/22:  1. Left ventricular ejection fraction, by estimation, is 60 to 65%. The  left ventricle has normal function. The left ventricle has no regional  wall motion abnormalities. There is moderate left ventricular hypertrophy.  Left ventricular diastolic  parameters are consistent with Grade I diastolic dysfunction (impaired  relaxation).   2. Right ventricular systolic function is normal. The right ventricular  size is normal. There is normal pulmonary artery systolic pressure.   3. A small pericardial effusion is present. There is no evidence of  cardiac tamponade.   4. The mitral valve is normal in structure. No evidence of mitral valve  regurgitation.   5. The aortic valve is tricuspid. Aortic valve regurgitation is not  visualized.   6. The inferior vena cava is normal in size with greater than 50%  respiratory variability, suggesting right atrial pressure of 3 mmHg.   Comparison(s): Outside Echo 11/14/2021 EF 40 %.   Assessment & Plan   1.  ***     {Are you ordering a CV Procedure (e.g. stress test, cath, DCCV, TEE, etc)?   Press F2        :[id number]}   Signed, Bettey Mare. Liborio Nixon, ANP, AACC   02/06/2023 12:21 PM      Office [phone] Fax [phone]  Notice: This dictation was prepared with Dragon dictation along with smaller phrase technology. Any transcriptional errors that result from this process are unintentional and may not be corrected upon review.

## 2023-02-07 ENCOUNTER — Ambulatory Visit: Payer: Self-pay | Attending: Physician Assistant | Admitting: Adult Health

## 2023-02-07 ENCOUNTER — Ambulatory Visit: Payer: Medicaid Other | Admitting: Physician Assistant

## 2023-02-15 ENCOUNTER — Other Ambulatory Visit: Payer: Self-pay | Admitting: Cardiology

## 2023-02-17 ENCOUNTER — Other Ambulatory Visit: Payer: Self-pay | Admitting: Cardiology

## 2023-05-29 ENCOUNTER — Telehealth: Payer: Self-pay | Admitting: Cardiology

## 2023-05-29 MED ORDER — SPIRONOLACTONE 25 MG PO TABS: 12.5000 mg | ORAL_TABLET | Freq: Every day | ORAL | 0 refills | Status: DC

## 2023-05-29 MED ORDER — NEBIVOLOL HCL 20 MG PO TABS: 1.0000 | ORAL_TABLET | Freq: Every day | ORAL | 0 refills | Status: DC

## 2023-05-29 MED ORDER — EMPAGLIFLOZIN 10 MG PO TABS: 10.0000 mg | ORAL_TABLET | Freq: Every day | ORAL | 0 refills | Status: DC

## 2023-05-29 MED ORDER — SACUBITRIL-VALSARTAN 49-51 MG PO TABS: 1.0000 | ORAL_TABLET | Freq: Two times a day (BID) | ORAL | 0 refills | Status: DC

## 2023-05-29 MED ORDER — NIFEDIPINE ER 60 MG PO TB24: 60.0000 mg | ORAL_TABLET | Freq: Every day | ORAL | 0 refills | Status: DC

## 2023-05-29 NOTE — Telephone Encounter (Signed)
*  STAT* If patient is at the pharmacy, call can be transferred to refill team.   1. Which medications need to be refilled? (please list name of each medication and dose if known)   empagliflozin  (JARDIANCE ) 10 MG TABS tablet  Nebivolol  HCl 20 MG TABS  NIFEdipine  (ADALAT  CC) 60 MG 24 hr tablet   sacubitril -valsartan  (ENTRESTO ) 49-51 MG  spironolactone  (ALDACTONE ) 25 MG tablet (Expired)    2. Which pharmacy/location (including street and city if local pharmacy) is medication to be sent to?  [facility] [address], [place] - [address]      3. Do they need a 30 day or 90 day supply? 90 day  Pt has upcoming office visit on 1/27 and is out of medication.

## 2023-06-07 NOTE — Progress Notes (Signed)
Cardiology Office Note   Date:  06/07/2023   ID:  Lynn Gallegos, DOB 16-Nov-1985, MRN [id number]  PCP:  Oneita Hurt, No  Cardiologist:   Nohelani Benning [place], MD   No chief complaint on file.     History of Present Illness: Lynn Gallegos is a 38 y.o. 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 She has a history of HTN, gestational diabetes and obesity. She presented to [facility] on 11/14/21 with complaints of severe chest pain. Pain radiated to her right arm and wrist and was associated with SOB. She was severely hypertensive with BP 180/115. Ecg reportedly showed ST elevation in anterior leads. She was given ASA, sl Ntg and IV labetalol with reported resolution of Ecg changes and improvement in chest pain. Patient reports symptoms lasted 20- 30 minutes then resolved. Hgb was 10.8. normal BNP. Normal glucose. Troponin peaked at 14.3. She was treated with Plavix, diltiazem and transferred to [facility] for further evaluation. Chest CT was negative. Echo showed EF 40-45% with segmental WMA involving the mid and distal anteroseptal, apex and mid inferior wall. Mild LVH. Cardiac cath showed severe disease in the LAD and second diagonal c/w SCAD. She was treated medically and DC on ASA, Plavix. Bystolic 20 mg daily. Procardia XL 60 mg in am and 30 mg in pm, Diltiazem 120 mg daily, Imdur 30 mg daily and telmisartan 20 mg daily.   The patient delivered her second pregnancy on May 4. This was complicated by gestational diabetes and pre-eclampsia. She delivered by C section. She reports HTN since her early 42s. Mother died with a cerebral aneurysm in her 83s.  Patient had carotid and renal duplex which were normal.   Echo in October 2023 showed normalization of LV function.  On follow up today she is doing well. No chest pain, dyspnea, edema or palpitations. She is completely occupied by work, school and children. No time for exercise. Had been out of her medication from before Xmas. Notes  amlodipine causes flushing, HA, and swelling.     Past Medical History:  Diagnosis Date   Gestational diabetes    Hypertension     Past Surgical History:  Procedure Laterality Date   CESAREAN SECTION N/A 06/26/2014   Procedure: CESAREAN SECTION;  Surgeon: Brock Bad, MD;  Location: [facility] ORS;  Service: Obstetrics;  Laterality: N/A;   CESAREAN SECTION N/A 09/16/2021   Procedure: CESAREAN SECTION;  Surgeon: Huel Cote, MD;  Location: [facility] ORS;  Service: Obstetrics;  Laterality: N/A;   CESAREAN SECTION       Current Outpatient Medications  Medication Sig Dispense Refill   empagliflozin (JARDIANCE) 10 MG TABS tablet Take 1 tablet (10 mg total) by mouth daily before breakfast. 90 tablet 0   Nebivolol HCl 20 MG TABS Take 1 tablet (20 mg total) by mouth daily. 90 tablet 0   NIFEdipine (ADALAT CC) 60 MG 24 hr tablet Take 1 tablet (60 mg total) by mouth daily. 90 tablet 0   sacubitril-valsartan (ENTRESTO) 49-51 MG Take 1 tablet by mouth 2 (two) times daily. 180 tablet 0   spironolactone (ALDACTONE) 25 MG tablet Take 0.5 tablets (12.5 mg total) by mouth daily. 45 tablet 0   No current facility-administered medications for this visit.    Allergies:   Patient has no known allergies.    Social History:  The patient  reports that she has never smoked. She has been exposed to tobacco smoke. She has never used  smokeless tobacco. She reports that she does not drink alcohol and does not use drugs.   Family History:  The patient's family history includes Aneurysm (age of onset: 73) in her mother; Cancer in her paternal grandmother; Diabetes in her father; Hypertension in her maternal grandmother and mother.    ROS:  Please see the history of present illness.   Otherwise, review of systems are positive for none.   All other systems are reviewed and negative.    PHYSICAL EXAM: VS:  There were no vitals taken for this visit. , BMI There is no height or weight on file to calculate  BMI. GEN: Well nourished, well developed, in no acute distress HEENT: normal Neck: no JVD, carotid bruits, or masses Cardiac: RRR; no murmurs, rubs, or gallops,no edema  Respiratory:  clear to auscultation bilaterally, normal work of breathing GI: soft, nontender, nondistended, + BS MS: no deformity or atrophy Skin: warm and dry, no rash Neuro:  Strength and sensation are intact Psych: euthymic mood, full affect   EKG Interpretation Date/Time:  Monday June 12 2023 09:41:13 EST Ventricular Rate:  83 PR Interval:  150 QRS Duration:  84 QT Interval:  354 QTC Calculation: 415 R Axis:   57  Text Interpretation: Normal sinus rhythm Nonspecific T wave abnormality When compared with ECG of 07-Oct-2021 00:55, No significant change was found Confirmed by [place], Ruston Fedora [phone]) on 06/12/2023 10:13:01 AM    Recent Labs: No results found for requested labs within last 365 days.   Dated 11/04/21: Hgb 11.7. CMET normal. Dated 11/14/21: cholesterol 134, triglycerides 118, LDL 65, HDL 45.   Lipid Panel No results found for: "CHOL", "TRIG", "HDL", "CHOLHDL", "VLDL", "LDLCALC", "LDLDIRECT"    Wt Readings from Last 3 Encounters:  08/10/22 288 lb 9.6 oz (130.9 kg)  02/07/22 273 lb (123.8 kg)  11/29/21 263 lb (119.3 kg)      Other studies Reviewed: Additional studies/ records that were reviewed today include:   Echo 11/14/21: Summary    Left Ventricle: Segmental wall motion abnormality present. The ejection fraction is mildly decreased. The estimated ejection fraction is 40-45%. Diastolic function is normal.   Tricuspid Valve: There is no evidence of pulmonary hypertension.  Hyperkinesis Not evaluated Normal wall motion  Moderate hypokinesis Akinesis Dyskinesis  Aneurysm Mild hypokinesis Severe hypokinesis Narrative  This result has an attachment that is not available.   Vital Signs  Height: 1.77 m. Weight: 122.0 kg. BSA: 2.45 m. BP: 144/93 mm Hg.  Heart rate: 68 bpm.   Procedure   Study quality: Technically difficult due to body habitus. Study type: 2D, m-mode and color flow Doppler study was performed. Status: stat.  Technologist:  Oralia Rud.    Reason for exam:  Myocardial Infarction.  2D  LVIDd 42 mm      (38-52) FS 36.1 %  LA diam systole 34 mm  LVIDd I 17.15 mm/m  (23.00-31.00) IVSd 13 mm  (6-9) LA diam systole Index 13.92 mm/m  LVIDs 27 mm      (22-35) LVPWd 13 mm  (6-9)  TAPSE 18 mm      (>=17)  LV Mass-c 208 g  (67-162)  LV MassI 85.0 g/m      (43.0-95.0)  LVOT diam 21 mm  Ao diam diastole 34 mm   Doppler  TR Vmax 2.25 m/s  AV VTI 21.8 cm  MV PGmax 2.30 mm Hg  TR PGmax 20.33 mm Hg  AVAI (Vmax) 0.86 cm/m  MV PGmean 1.05 mm Hg  RVOT PGmax  2.31 mm Hg  AVA (VTI) 2.08 cm  (3.00-4.00) MVA (PHT) 3.94 cm  RVSP 23.33 mm Hg  AV Vmax 1.02 m/s  RAP 3.00 mm Hg  AV PGmax 4.13 mm Hg  PV Vmax 1.02 m/s  AV PGmean 2.53 mm Hg  PV PGmax 4.13 mm Hg  AVA (Vmax) 2.10 cm  (3.00-4.00)  LVOT Vmax 0.64 m/s  AVAI (VTI) 0.85 cm/m  LVOT PGmax 1.62 mm Hg  LVOT VTI/AV VTI 0.62  LVOT VTI 13.5 cm   Diastolic Function  MV E Vel 0.52 m/s  E'(l) 4.65 cm/s  (14.00-25.60)  MV A Vel 0.67 m/s  E'(s) 5.72 cm/s  (10.10-20.90)  MV E/A 0.77      (0.73-2.33) E/E'(l) 11.20  MV Dec. Time 192 ms      (138-194) E/E'(s) 9.10  E/E' mean 10.04   Left Ventricle  The left ventricle cavity size is normal. Segmental wall motion abnormality present. The ejection fraction is mildly decreased. The estimated ejection fraction is 40-45%. The diastolic filling pattern is normal. Severe hypokinesis: mid anterior septal, mid inferior septal, apical septal, apex. Normal wall motion: all remaining segments. There is mild concentric LV hypertrophy visualized.   Left Atrium  The left atrium cavity size is normal.   Right Atrium  The right atrium cavity size is normal.   Right Ventricle  The right ventricle cavity size is normal. Normal systolic function is visualized.   Aortic Valve  Normal aortic valve  structure with no stenosis or regurgitation.   Mitral Valve  Normal mitral valve structure with no stenosis or regurgitation.   Tricuspid Valve  Normal Tricuspid valve structure with no stenosis or regurgitation. The pulmonary pressure is normal. There is no evidence of pulmonary hypertension. Trace amount of tricuspid regurgitation is visualized.  RVSP 23.33 mm Hg  RAP 3.00 mm Hg  TR Vmax 2.25 m/s   Pulmonic Valve  Normal pulmonic valve structure with no stenosis or regurgitation.   Pericardium  The pericardium is normal.   Aorta  here is a normal size of aorta, normal ascending aorta and aortic arch.   Pulmonary Artery  Normal pulmonary artery with no dilatation.   IVC / Hepatic Veins  The inferior vena cava is normal in size with respiratory change > 50 %. 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  Diffuse, severe narrowing.    Patient had transient ST segment elevation 2 days prior to cardiac  catheterization.  Symptoms and ST segment elevation have resolved without  recurrence on medical therapy.  Echocardiogram demonstrated anterior  apical and septal hypokinesis with ejection fraction 40-45%.      RECOMMENDATIONS:   Recommend intensive medical therapy with in-hospital observation 3-5 days  prior to discharge.  Reserve percutaneous coronary intervention for  recurrent, refractory angina given the high risk of complications in the  setting SCAD. Narrative  This result has an attachment that is not available.  Images from the original result were not included.     Dist LAD lesion is 90% stenosed.    3rd Diag lesion is 90% stenosed.   Coronary Findings Diagnostic Dominance: Right  Left Anterior Descending: Dist LAD lesion is 90% stenosed. Third Diagonal Branch: 3rd Diag lesion is 90% stenosed.  First  Obtuse Marginal Branch: The vessel is small in caliber.   Intervention  No interventions have been documented. Performed Procedures  LEFT HEART CATH WITH CORONARY ANGIOGRAPHY Exam End: 11/15/21 10:57 Last Resulted: 11/17/21 21:34  Received From: [facility]     Echo 02/18/22: IMPRESSIONS     1. Left ventricular ejection fraction, by estimation, is 60 to 65%. The  left ventricle has normal function. The left ventricle has no regional  wall motion abnormalities. There is moderate left ventricular hypertrophy.  Left ventricular diastolic  parameters are consistent with Grade I diastolic dysfunction (impaired  relaxation).   2. Right ventricular systolic function is normal. The right ventricular  size is normal. There is normal pulmonary artery systolic pressure.   3. A small pericardial effusion is present. There is no evidence of  cardiac tamponade.   4. The mitral valve is normal in structure. No evidence of mitral valve  regurgitation.   5. The aortic valve is tricuspid. Aortic valve regurgitation is not  visualized.   6. The inferior vena cava is normal in size with greater than 50%  respiratory variability, suggesting right atrial pressure of 3 mmHg.   Comparison(s): Outside Echo 11/14/2021 EF 40 %.   ASSESSMENT AND PLAN:  1.  S/p NSTEMI with SCAD involving the mid to distal LAD and second diagonal. She is asymptomatic. Continue ASA.  2. Chronic systolic CHF. EF 40-45% initially with SCAD. Subsequent Echo showed complete recovery of LV function.  Given issues with getting medication we will tailor BP meds to mitigate cost. See below. .  3. HTN longstanding. Normal renal duplex.  Will stop Entresto, Jardiance, amlodipine and nebivolol. Start valsartan 160 mg daily, Coreg 25 mg bid. Continue spironolactone. Low sodium diet. Call if BP doesn't come down. With this regimen all her meds will be generic. Will update labs today 4. Gestational DM 5. Pre-eclampsia.  Patient is not planning on having more children.     Current medicines are reviewed at length with the patient today.  The patient does not have concerns regarding medicines.  The following changes have been made:  see above  Labs/ tests ordered today include:   No orders of the defined types were placed in this encounter.        Disposition:   FU 6 months  Signed, Shanine Kreiger [place], MD  06/07/2023 9:56 AM    [facility] [address] [place], [place], [address] Phone [phone], Fax [phone]

## 2023-06-12 ENCOUNTER — Encounter: Payer: Self-pay | Admitting: Cardiology

## 2023-06-12 ENCOUNTER — Ambulatory Visit: Payer: Federal, State, Local not specified - PPO | Attending: Cardiology | Admitting: Cardiology

## 2023-06-12 VITALS — BP 140/110 | HR 83 | Ht 70.0 in | Wt 297.0 lb

## 2023-06-12 DIAGNOSIS — I25110 Atherosclerotic heart disease of native coronary artery with unstable angina pectoris: Secondary | ICD-10-CM | POA: Diagnosis not present

## 2023-06-12 DIAGNOSIS — I2542 Coronary artery dissection: Secondary | ICD-10-CM | POA: Diagnosis not present

## 2023-06-12 DIAGNOSIS — I10 Essential (primary) hypertension: Secondary | ICD-10-CM

## 2023-06-12 LAB — CBC

## 2023-06-12 MED ORDER — VALSARTAN 160 MG PO TABS: 160.0000 mg | ORAL_TABLET | Freq: Every day | ORAL | 3 refills | Status: AC

## 2023-06-12 MED ORDER — CARVEDILOL 25 MG PO TABS: 25.0000 mg | ORAL_TABLET | Freq: Two times a day (BID) | ORAL | 3 refills | Status: AC

## 2023-06-12 NOTE — Patient Instructions (Addendum)
Medication Instructions:  STOP Entresto STOP Jardiance STOP Amlodipine STOP Nebivolol START Valsartan (DIOVAN) 160 mg tablet START Carvedilol (COREG) 25 mg twice daily CONT Spironolactone (ALDACTONE) 25 mg tablet  *If you need a refill on your cardiac medications before your next appointment, please call your pharmacy*   Lab Work: CBC, CMET, Lipid Panel today    Testing/Procedures: NONE   Follow-Up: At [facility], you and your health needs are our priority.  As part of our continuing mission to provide you with exceptional heart care, we have created designated Provider Care Teams.  These Care Teams include your primary Cardiologist (physician) and Advanced Practice Providers (APPs -  Physician Assistants and Nurse Practitioners) who all work together to provide you with the care you need, when you need it.   Your next appointment:   6 month(s)  Provider:   Peter [place], MD     Other Instructions CALL our office if you blood pressure continues to be elevated

## 2023-06-13 LAB — CBC
Hematocrit: 38.2 % (ref 34.0–46.6)
Hemoglobin: 11.7 g/dL (ref 11.1–15.9)
MCH: 24 pg — ABNORMAL LOW (ref 26.6–33.0)
MCHC: 30.6 g/dL — ABNORMAL LOW (ref 31.5–35.7)
MCV: 78 fL — ABNORMAL LOW (ref 79–97)
Platelets: 288 10*3/uL (ref 150–450)
RBC: 4.88 x10E6/uL (ref 3.77–5.28)
RDW: 14.3 % (ref 11.7–15.4)
WBC: 8.1 10*3/uL (ref 3.4–10.8)

## 2023-06-13 LAB — COMPREHENSIVE METABOLIC PANEL
ALT: 12 [IU]/L (ref 0–32)
AST: 13 [IU]/L (ref 0–40)
Albumin: 4 g/dL (ref 3.9–4.9)
Alkaline Phosphatase: 103 [IU]/L (ref 44–121)
BUN/Creatinine Ratio: 14 (ref 9–23)
BUN: 11 mg/dL (ref 6–20)
Bilirubin Total: 0.3 mg/dL (ref 0.0–1.2)
CO2: 24 mmol/L (ref 20–29)
Calcium: 9.3 mg/dL (ref 8.7–10.2)
Chloride: 102 mmol/L (ref 96–106)
Creatinine, Ser: 0.78 mg/dL (ref 0.57–1.00)
Globulin, Total: 3.3 g/dL (ref 1.5–4.5)
Glucose: 88 mg/dL (ref 70–99)
Potassium: 3.9 mmol/L (ref 3.5–5.2)
Sodium: 137 mmol/L (ref 134–144)
Total Protein: 7.3 g/dL (ref 6.0–8.5)
eGFR: 102 mL/min/{1.73_m2} (ref >59)

## 2023-06-13 LAB — LIPID PANEL
Chol/HDL Ratio: 3.5 {ratio} (ref 0.0–4.4)
Cholesterol, Total: 133 mg/dL (ref 100–199)
HDL: 38 mg/dL — ABNORMAL LOW (ref >39)
LDL Chol Calc (NIH): 70 mg/dL (ref 0–99)
Triglycerides: 144 mg/dL (ref 0–149)
VLDL Cholesterol Cal: 25 mg/dL (ref 5–40)

## 2023-08-24 ENCOUNTER — Other Ambulatory Visit: Payer: Self-pay | Admitting: Cardiology

## 2023-08-24 MED ORDER — SPIRONOLACTONE 25 MG PO TABS: 12.5000 mg | ORAL_TABLET | Freq: Every day | ORAL | 0 refills | Status: DC

## 2023-08-29 ENCOUNTER — Telehealth: Payer: Self-pay

## 2023-08-29 NOTE — Telephone Encounter (Signed)
Message sent to Dr.Jordan for advice. 

## 2023-08-29 NOTE — Telephone Encounter (Signed)
 [facility] pharmacy is requesting clarification for medication spironolactone. Pharmacy states that there is a major risk- Hyperkalemia, pt is also taking valsartan. Does Dr. [place] want pt to continue taking both medication and is Dr. [place] aware of the risk? Please address

## 2023-08-30 NOTE — Telephone Encounter (Signed)
 Spoke to PharmD at [facility] Dr.Jordan advised patient needs to continue both valsartan and spironolactone as prescribed.

## 2023-11-24 ENCOUNTER — Other Ambulatory Visit: Payer: Self-pay | Admitting: Cardiology

## 2024-03-24 IMAGING — CT CT ANGIO CHEST
2 of 10 series · 18 of 36 positions shown · IV contrast (agent unspecified)
Comparison: None Available.

CLINICAL DATA: Back pain that progressed chest pain.

EXAM:
CT ANGIOGRAPHY CHEST WITH CONTRAST
TECHNIQUE: Multidetector CT imaging of the chest was performed using the
standard protocol during bolus administration of intravenous
contrast. Multiplanar CT image reconstructions and MIPs were
obtained to evaluate the vascular anatomy.

[Series 8: pe thins · axial · 0.95mm/px · z∈[+570,+781]mm · 17 of 316 slices shown]
[im 17/316  lung]
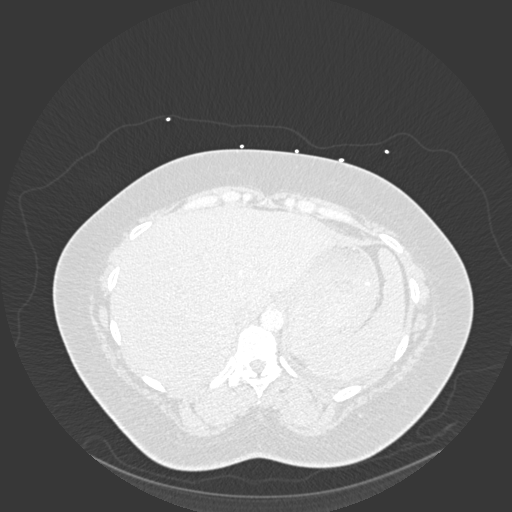
[im 34/316  mediastinal]
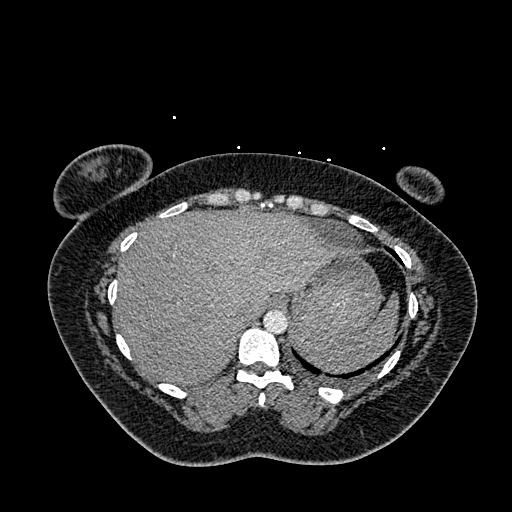
[im 50/316  lung]
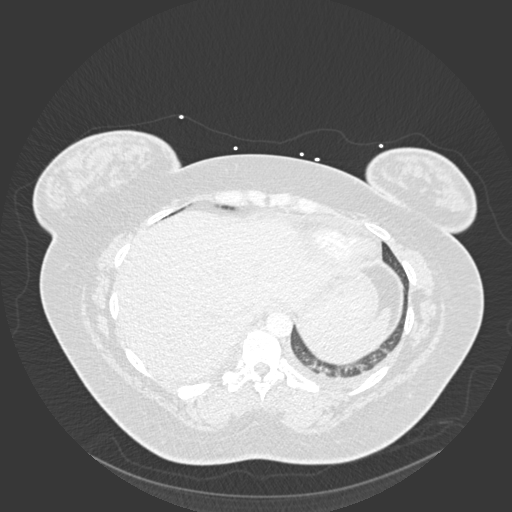
[im 67/316  mediastinal]
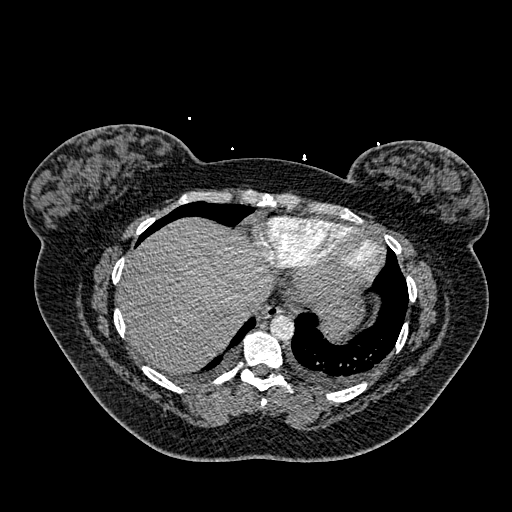
[im 83/316  lung]
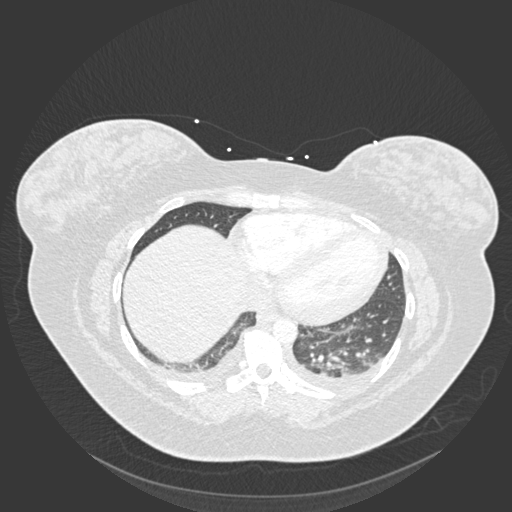
[im 100/316  mediastinal]
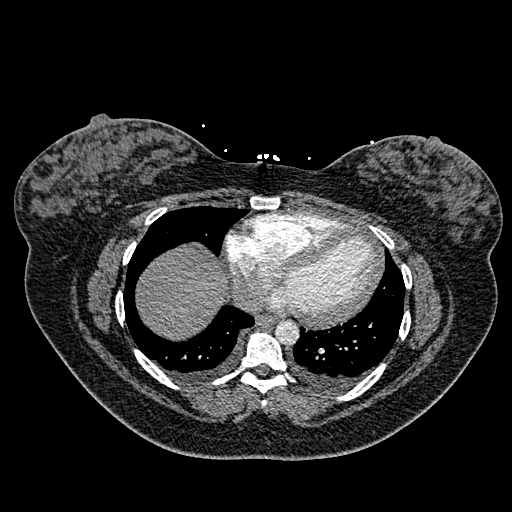
[im 117/316  lung]
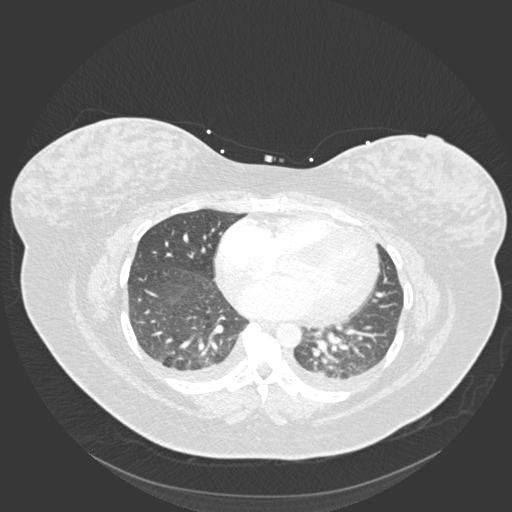
[im 133/316  mediastinal]
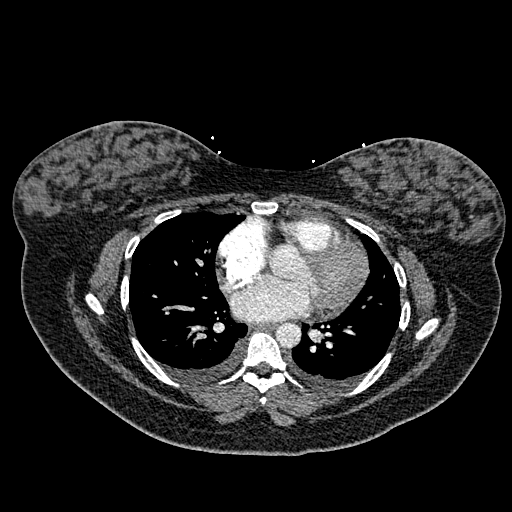
[im 166/316  lung]
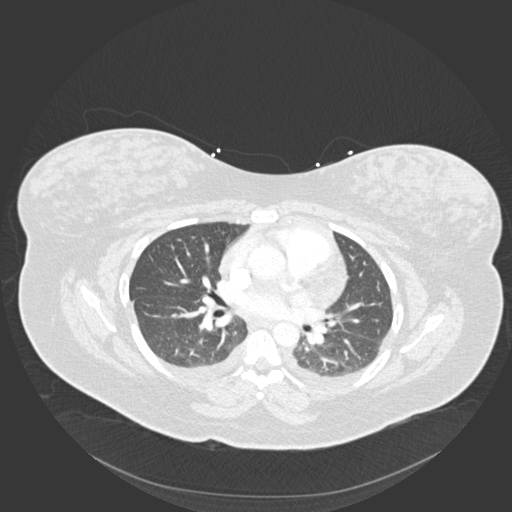
[im 183/316  mediastinal]
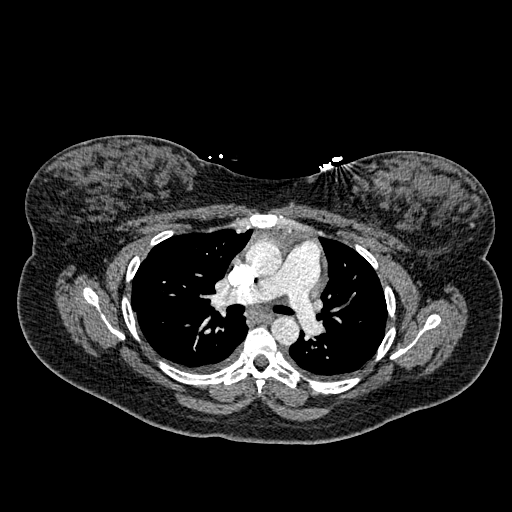
[im 199/316  lung]
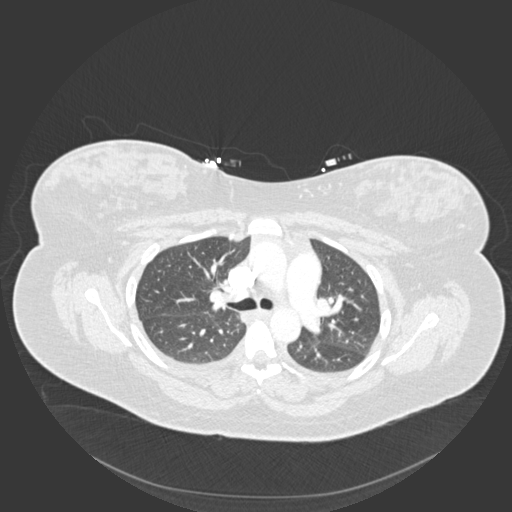
[im 216/316  mediastinal]
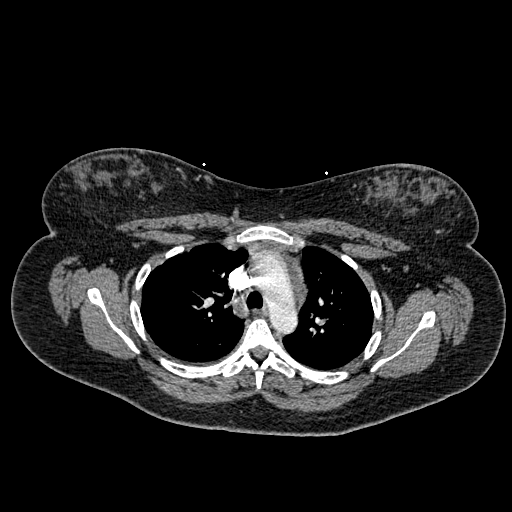
[im 233/316  lung]
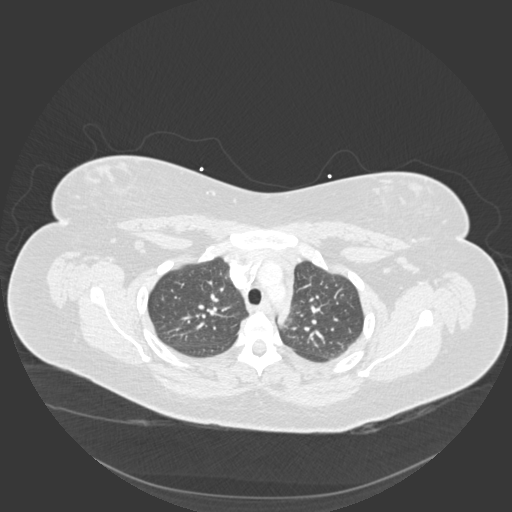
[im 249/316  mediastinal]
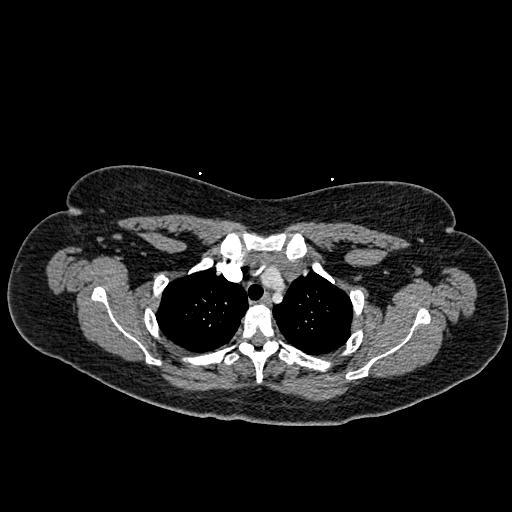
[im 266/316  lung]
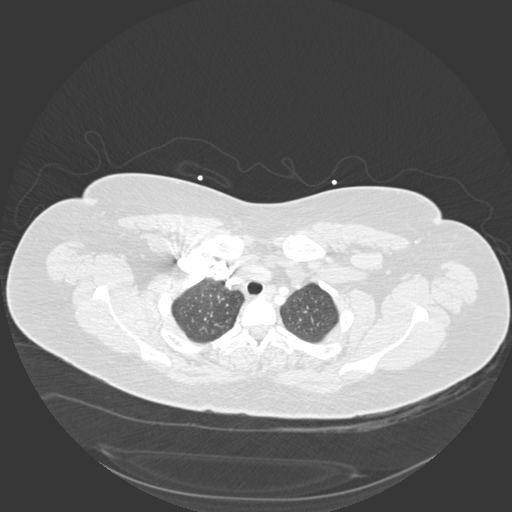
[im 282/316  mediastinal]
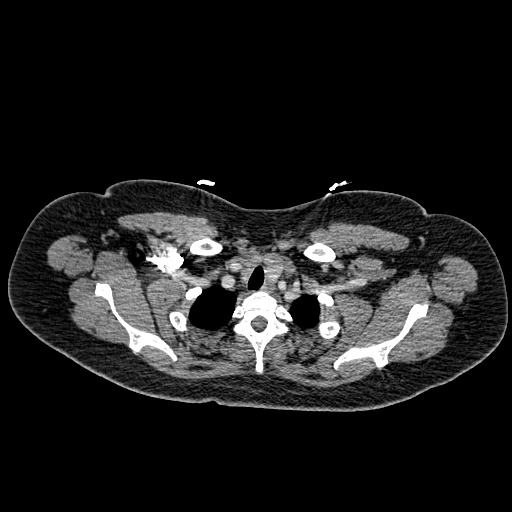
[im 299/316  lung]
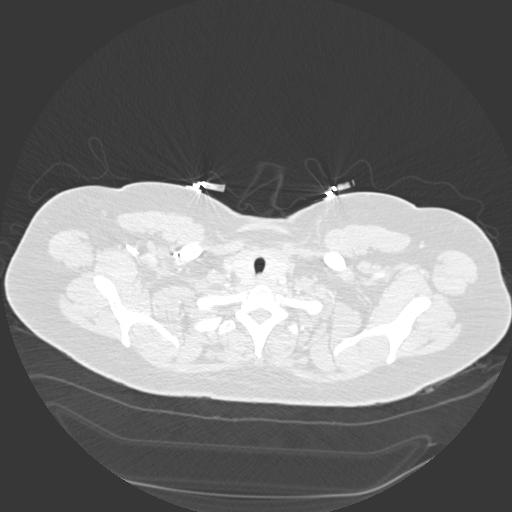

[Series 9: pe coronal mpr · coronal · 0.48mm/px · 1 of 93 slices shown]
[im 47/93  mediastinal]
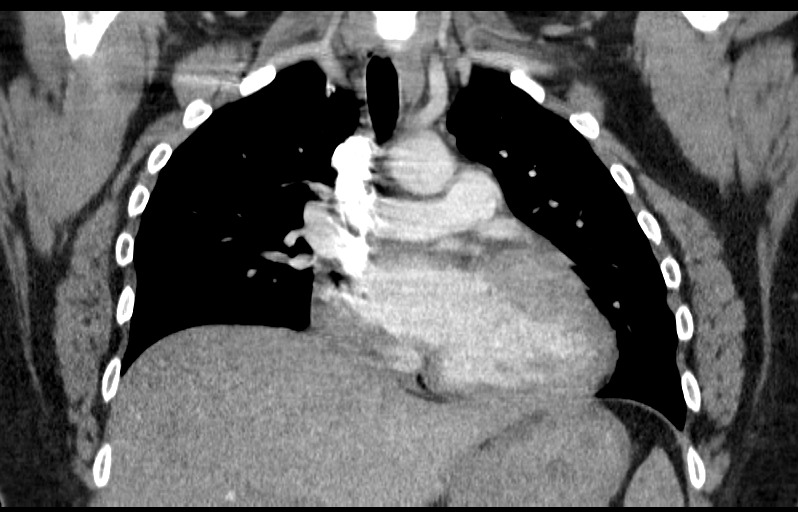

[18 of 36 positions shown; findings below may reference images not displayed]

RADIATION DOSE REDUCTION: This exam was performed according to the
departmental dose-optimization program which includes automated
exposure control, adjustment of the mA and/or kV according to
patient size and/or use of iterative reconstruction technique.

CONTRAST:  100mL OMNIPAQUE IOHEXOL 350 MG/ML SOLN
FINDINGS: Cardiovascular: There is no evidence of thoracic aortic aneurysm or
dissection. The subsegmental pulmonary arteries are limited in
evaluation secondary to suboptimal opacification with intravenous
contrast. No evidence of pulmonary embolism. Normal heart size. No
pericardial effusion.

Mediastinum/Nodes: No enlarged mediastinal, hilar, or axillary lymph
nodes. Thyroid gland, trachea, and esophagus demonstrate no
significant findings.

Lungs/Pleura: Very mild atelectasis is seen within the bilateral
lung bases.

Small bilateral pleural effusions are noted.

No pneumothorax is identified.

Upper Abdomen: No acute abnormality.

Musculoskeletal: No chest wall abnormality. No acute or significant
osseous findings.

Review of the MIP images confirms the above findings.
IMPRESSION: 1. Limited study without evidence of pulmonary embolism.
2. Small bilateral pleural effusions.
3. Very mild bilateral lower lobe atelectasis.

## 2024-05-24 ENCOUNTER — Other Ambulatory Visit: Payer: Self-pay | Admitting: Cardiology
